# Patient Record
Sex: Female | Born: 1980 | Hispanic: Yes | Marital: Single
Health system: Midwestern US, Community
[De-identification: ages and names within clinical notes are randomized; demographics above are authoritative.]

## PROBLEM LIST (undated history)

## (undated) DIAGNOSIS — I10 Essential (primary) hypertension: Secondary | ICD-10-CM

## (undated) DIAGNOSIS — G8929 Other chronic pain: Secondary | ICD-10-CM

## (undated) DIAGNOSIS — D649 Anemia, unspecified: Secondary | ICD-10-CM

## (undated) DIAGNOSIS — W3400XA Accidental discharge from unspecified firearms or gun, initial encounter: Secondary | ICD-10-CM

## (undated) DIAGNOSIS — M549 Dorsalgia, unspecified: Secondary | ICD-10-CM

## (undated) DIAGNOSIS — Y249XXA Unspecified firearm discharge, undetermined intent, initial encounter: Secondary | ICD-10-CM

## (undated) DIAGNOSIS — M069 Rheumatoid arthritis, unspecified: Secondary | ICD-10-CM

## (undated) DIAGNOSIS — N390 Urinary tract infection, site not specified: Secondary | ICD-10-CM

## (undated) DIAGNOSIS — Z803 Family history of malignant neoplasm of breast: Secondary | ICD-10-CM

## (undated) HISTORY — DX: Family history of malignant neoplasm of breast: Z80.3

## (undated) HISTORY — PX: NASAL SEPTUM SURGERY: SHX37

## (undated) HISTORY — DX: Anemia, unspecified: D64.9

## (undated) HISTORY — PX: TUBAL LIGATION: SHX77

---

## 2015-02-27 ENCOUNTER — Emergency Department (HOSPITAL_BASED_OUTPATIENT_CLINIC_OR_DEPARTMENT_OTHER)
Admission: EM | Admit: 2015-02-27 | Discharge: 2015-02-27 | Disposition: A | Discharge status: Home / Self Care | Payer: Self-pay | Attending: Emergency Medicine | Admitting: Emergency Medicine

## 2015-02-27 ENCOUNTER — Encounter (HOSPITAL_BASED_OUTPATIENT_CLINIC_OR_DEPARTMENT_OTHER): Payer: Self-pay | Admitting: Emergency Medicine

## 2015-02-27 DIAGNOSIS — Y9389 Activity, other specified: Secondary | ICD-10-CM | POA: Insufficient documentation

## 2015-02-27 DIAGNOSIS — X58XXXA Exposure to other specified factors, initial encounter: Secondary | ICD-10-CM | POA: Insufficient documentation

## 2015-02-27 DIAGNOSIS — Y998 Other external cause status: Secondary | ICD-10-CM | POA: Insufficient documentation

## 2015-02-27 DIAGNOSIS — Y9289 Other specified places as the place of occurrence of the external cause: Secondary | ICD-10-CM | POA: Insufficient documentation

## 2015-02-27 DIAGNOSIS — T192XXA Foreign body in vulva and vagina, initial encounter: Secondary | ICD-10-CM | POA: Insufficient documentation

## 2015-02-27 NOTE — ED Notes (Signed)
Pt states that she has a condom stuck in her vagina that has been there since 2330.  Pt denies any current pain.

## 2015-02-27 NOTE — ED Notes (Signed)
Pt has condom in vagina x 40 min PTA

## 2015-02-27 NOTE — ED Notes (Signed)
Pt verbalizes understanding of d/c instructions and denies any further need at this time. 

## 2015-02-27 NOTE — ED Provider Notes (Signed)
CSN: [id number]     Arrival date & time 02/27/15  0006 History   First MD Initiated Contact with Patient 02/27/15 0141     Chief Complaint  Patient presents with  . Foreign Body in Vagina     (Consider location/radiation/quality/duration/timing/severity/associated sxs/prior Treatment) HPI  This is a 34 year old female who has had a condom stuck in her vagina since about 11:30 yesterday evening. She is not having any pain or bleeding.  History reviewed. No pertinent past medical history. History reviewed. No pertinent past surgical history. No family history on file. Social History  Substance Use Topics  . Smoking status: Never Smoker   . Smokeless tobacco: None  . Alcohol Use: None   OB History    No data available     Review of Systems  All other systems reviewed and are negative.   Allergies  Review of patient's allergies indicates no known allergies.  Home Medications   Prior to Admission medications   Not on File   BP 116/75 mmHg  Pulse 99  Temp(Src) 98 F (36.7 C) (Oral)  Resp 18  SpO2 100%   Physical Exam  General: Well-developed, well-nourished female in no acute distress; appearance consistent with age of record HENT: normocephalic; atraumatic Eyes: Normal appearance Neck: supple Heart: regular rate and rhythm Lungs: Normal respiratory effort and excursion Abdomen: soft; nondistended GU: Normal external genitalia; foreign object consistent with a condom within vaginal vault Extremities: No deformity; full range of motion Neurologic: Awake, alert and oriented; motor function intact in all extremities and symmetric; no facial droop Skin: Warm and dry Psychiatric: Normal mood and affect    ED Course  Procedures (including critical care time)   MDM  Condom removed manually with lubricated gloved hand.   Paula Libra, MD 02/27/15 [phone]

## 2015-03-06 ENCOUNTER — Encounter (HOSPITAL_BASED_OUTPATIENT_CLINIC_OR_DEPARTMENT_OTHER): Payer: Self-pay

## 2015-03-06 ENCOUNTER — Emergency Department (HOSPITAL_BASED_OUTPATIENT_CLINIC_OR_DEPARTMENT_OTHER)
Admission: EM | Admit: 2015-03-06 | Discharge: 2015-03-06 | Disposition: A | Discharge status: Home / Self Care | Payer: Self-pay | Attending: Emergency Medicine | Admitting: Emergency Medicine

## 2015-03-06 DIAGNOSIS — J029 Acute pharyngitis, unspecified: Secondary | ICD-10-CM | POA: Insufficient documentation

## 2015-03-06 LAB — RAPID STREP SCREEN (MED CTR MEBANE ONLY): Streptococcus, Group A Screen (Direct): NEGATIVE

## 2015-03-06 MED ORDER — IBUPROFEN 800 MG PO TABS: 800.0000 mg | ORAL_TABLET | Freq: Three times a day (TID) | ORAL | Status: DC

## 2015-03-06 MED ORDER — DEXAMETHASONE SODIUM PHOSPHATE 10 MG/ML IJ SOLN
10.0000 mg | Freq: Once | INTRAMUSCULAR | Status: AC
  Administered 2015-03-06: 10 mg via INTRAMUSCULAR
  Filled 2015-03-06: qty 1

## 2015-03-06 MED ORDER — PENICILLIN G BENZATHINE 1200000 UNIT/2ML IM SUSP
1.2000 10*6.[IU] | Freq: Once | INTRAMUSCULAR | Status: AC
  Administered 2015-03-06: 1.2 10*6.[IU] via INTRAMUSCULAR
  Filled 2015-03-06: qty 2

## 2015-03-06 NOTE — ED Notes (Signed)
Patient preparing for discharge. 

## 2015-03-06 NOTE — ED Notes (Signed)
MD at bedside. 

## 2015-03-06 NOTE — ED Notes (Signed)
Reports sore throat that started yesterday.  Reports difficulty eating due to pain.  Reports having chills and fever.

## 2015-03-06 NOTE — ED Provider Notes (Signed)
CSN: [id number]     Arrival date & time 03/06/15  0756 History   First MD Initiated Contact with Patient 03/06/15 [phone]     Chief Complaint  Patient presents with  . Sore Throat     (Consider location/radiation/quality/duration/timing/severity/associated sxs/prior Treatment) Patient is a 34 y.o. female presenting with pharyngitis.  Sore Throat This is a new problem. The current episode started 12 to 24 hours ago. The problem occurs constantly. The problem has been gradually worsening. Pertinent negatives include no chest pain, no abdominal pain, no headaches and no shortness of breath. Nothing aggravates the symptoms. Nothing relieves the symptoms. She has tried nothing for the symptoms. The treatment provided mild relief.    History reviewed. No pertinent past medical history. History reviewed. No pertinent past surgical history. No family history on file. Social History  Substance Use Topics  . Smoking status: Never Smoker   . Smokeless tobacco: None  . Alcohol Use: No   OB History    No data available     Review of Systems  Constitutional: Positive for fever and chills.  HENT: Positive for sore throat. Negative for congestion, ear pain and facial swelling.   Respiratory: Negative for cough, shortness of breath, wheezing and stridor.   Cardiovascular: Negative for chest pain.  Gastrointestinal: Negative for nausea, vomiting and abdominal pain.  Neurological: Negative for headaches.      Allergies  Review of patient's allergies indicates no known allergies.  Home Medications   Prior to Admission medications   Medication Sig Start Date End Date Taking? Authorizing Provider  ibuprofen (ADVIL,MOTRIN) 800 MG tablet Take 1 tablet (800 mg total) by mouth 3 (three) times daily. 03/06/15   Marily Memos, MD   BP 114/86 mmHg  Pulse 80  Temp(Src) 98.7 F (37.1 C) (Oral)  Resp 16  Ht 5\' 2"  (1.575 m)  Wt 149 lb (67.586 kg)  BMI 27.25 kg/m2  SpO2 100%  LMP  02/16/2015 Physical Exam  Constitutional: She is oriented to person, place, and time. She appears well-developed and well-nourished.  HENT:  Head: Normocephalic and atraumatic.  Mouth/Throat: Uvula is midline. Oropharyngeal exudate, posterior oropharyngeal edema and posterior oropharyngeal erythema present. No tonsillar abscesses.  Eyes: Conjunctivae and EOM are normal. Right eye exhibits no discharge. Left eye exhibits no discharge.  Cardiovascular: Normal rate and regular rhythm.   Pulmonary/Chest: Effort normal and breath sounds normal. No respiratory distress.  Abdominal: Soft. She exhibits no distension. There is no tenderness. There is no rebound.  Musculoskeletal: Normal range of motion. She exhibits no edema or tenderness.  Neurological: She is alert and oriented to person, place, and time.  Skin: Skin is warm and dry.  Nursing note and vitals reviewed.   ED Course  Procedures (including critical care time) Labs Review Labs Reviewed  RAPID STREP SCREEN (NOT AT [facility])    Imaging Review No results found. I have personally reviewed and evaluated these images and lab results as part of my medical decision-making.   EKG Interpretation None      MDM   Final diagnoses:  Pharyngitis   34 year old female with 1 day of sore throat. The no trouble breathing, nausea, vomiting or dysphagia. Exam as above without any evidence of PTA. No stridor to suggest RPA. Treated with Decadron and penicillin. Will go home with ibuprofen.  I have personally and contemperaneously reviewed labs and imaging and used in my decision making as above.   A medical screening exam was performed and I feel the patient has  had an appropriate workup for their chief complaint at this time and likelihood of emergent condition existing is low. They have been counseled on decision, discharge, follow up and which symptoms necessitate immediate return to the emergency department. They or their family verbally  stated understanding and agreement with plan and discharged in stable condition.      Marily Memos, MD 03/06/15 [phone]

## 2015-03-06 NOTE — Discharge Instructions (Signed)
°Emergency Department Resource Guide °1) Find a Doctor and Pay Out of Pocket °Although you won't have to find out who is covered by your insurance plan, it is a good idea to ask around and get recommendations. You will then need to call the office and see if the doctor you have chosen will accept you as a new patient and what types of options they offer for patients who are self-pay. Some doctors offer discounts or will set up payment plans for their patients who do not have insurance, but you will need to ask so you aren't surprised when you get to your appointment. ° °2) Contact Your Local Health Department °Not all health departments have doctors that can see patients for sick visits, but many do, so it is worth a call to see if yours does. If you don't know where your local health department is, you can check in your phone book. The CDC also has a tool to help you locate your state's health department, and many state websites also have listings of all of their local health departments. ° °3) Find a Walk-in Clinic °If your illness is not likely to be very severe or complicated, you may want to try a walk in clinic. These are popping up all over the country in pharmacies, drugstores, and shopping centers. They're usually staffed by nurse practitioners or physician assistants that have been trained to treat common illnesses and complaints. They're usually fairly quick and inexpensive. However, if you have serious medical issues or chronic medical problems, these are probably not your best option. ° °No Primary Care Doctor: °- Call [facility] at  832-8000 - they can help you locate a primary care doctor that  accepts your insurance, provides certain services, etc. °- Physician Referral Service- 1-800-533-3463 ° °Chronic Pain Problems: °Organization         Address  Phone   Notes  °[facility]  (336) 297-2271 Patients need to be referred by their primary care doctor.  ° °Medication  Assistance: °Organization         Address  Phone   Notes  °[place] [facility] [address] °[place], [place] [address] (336) 641-8030 --Must be a resident of [place] °-- Must have NO insurance coverage whatsoever (no Medicaid/ Medicare, etc.) °-- The pt. MUST have a primary care doctor that directs their care regularly and follows them in the community °  °[facility]  (866) 331-1348   °[employer]  (888) 892-1162   ° °Agencies that provide inexpensive medical care: °Organization         Address  Phone   Notes  °[facility]  (336) 832-8035   °[facility]    (336) 832-7272   °[facility] [address] °[place], [place] [address] (336) 832-4777   °[facility] [address], °[place] (336) 271-4999   °[facility]    (336) 373-0678   °[facility]    (336) 272-1050   °[facility] ° [address], [place] Phone:  (336) 832-4444, Fax:  (336) 832-4440 Hours of Operation:  9 am - 6 pm, M-F.  Also accepts Medicaid/Medicare and self-pay.  °[facility] ° [address], [place] Phone: (336) 832-3150, Fax: (336) 832-3151. Hours of Operation:  8:30 am - 5:30 pm, M-F.  Also accepts Medicaid and self-pay.  °[facility] [place] [address], [place] Phone: (336) 878-6027   °[facility] [address], [place], [place] (336)723-1848, Ext. 123 Mondays & Thursdays: 7-9 AM.  First 15 patients are seen on a first come, first serve basis. °  ° °Medicaid-accepting [place] Providers: ° °Organization         Address  Phone   Notes  °[facility] [address], [place] (336) 641-2100 Also accepts self-pay patients.  °[facility] [address], [place] ° (336) 856-9996   °[facility] [address], [place]  (336) 288-8857   °[facility] [address] [place] [address], [place] (336) 299-7000   °Veita Bland [address], [place]  ° (336) 373-1557 Only accepts [place] Access Medicaid patients after they have their name applied to their card.  ° °Self-Pay (no insurance) in [place]: ° °Organization         Address  Phone   Notes  °Sickle Cell Patients, [facility] [address], [place] (336) 832-1970   °[facility] [address], [place] (336) 832-4400   °[facility] ° [address], [place] (336) 992-4800   °[facility]/Dr. Osei-Bonsu ° [address] [place] [address], [place] or [address], [place] (336) 841-8500 Phone number for both [place] and [place] locations is the same.  °[facility] [address], [place] (336) 299-0000   °[facility] [address] [place] [address], [place] or [address] (336) 852-7530 °(336) 878-2260   °[facility] [address], [place] (336) 350-1642, phone; (336) 294-5005, fax Sees patients 1st and 3rd Saturday of every month.  Must not qualify for public or private insurance (i.e. Medicaid, Medicare, [place] Health Choice, Veterans' Benefits) • Household income should be no more than 200% of the poverty level •The clinic cannot treat you if you are pregnant or think you are pregnant • Sexually transmitted diseases are not treated at the clinic.  ° ° °Dental Care: °Organization         Address  Phone  Notes  °[place] [facility] [address], [place] (336) 641-6152 Accepts children up to age 21 who are enrolled in Medicaid or [place] Health Choice; pregnant women with a Medicaid card; and children who have applied for Medicaid or [place] Health Choice, but were declined, whose parents can pay a reduced fee at time of service.  °[place]  [facility] [place]  [address], [place] (336) 641-7733 Accepts children up to age 21 who are enrolled in Medicaid or [place] Health Choice; pregnant women with a Medicaid card; and children who have applied for Medicaid or [place] Health Choice, but were declined, whose parents can pay a reduced fee at time of service.  °[facility] ° [address], [place] (336) 641-4533 Patients are seen by appointment only. Walk-ins are not accepted. [facility] will see patients 18 years of age and older. °Monday - Tuesday (8am-5pm) °Most Wednesdays (8:30-5pm) °$30 per visit, cash only  °[facility] ° [address], [place] (336) 641-4533 Patients are seen by appointment only. Walk-ins are not accepted. [facility] will see patients 18 years of age and older. °One   Wednesday Evening (Monthly: Volunteer Based).  $30 per visit, cash only  °[facility]  (919) 537-3737 for adults; Children under age 4, call Graduate Pediatric Dentistry at (919) [phone]. Children aged 4-14, please call (919) [phone] to request a pediatric application. ° Dental services are provided in all areas of dental care including fillings, crowns and bridges, complete and partial dentures, implants, gum treatment, root canals, and extractions. Preventive care is also provided. Treatment is provided to both adults and children. °Patients are selected via a lottery and there is often a waiting list. °  °[facility] [address], °[place] ° (336) 763-8833 [email or web address] °  °[facility] [address], [place], [place] (336)723-1848, Ext. 123 Second and Fourth Thursday of each month, opens at 6:30 AM; Clinic ends at 9 AM.  Patients are seen on a first-come first-served basis, and a limited number are seen during each clinic.  ° °[facility] ° [address], [place], [place] (336) 723-7904    Eligibility Requirements °You must have lived in [place], [place], or [place] counties for at least the last three months. °  You cannot be eligible for state or federal sponsored healthcare insurance, including Veterans Administration, Medicaid, or Medicare. °  You generally cannot be eligible for healthcare insurance through your employer.  °  How to apply: °Eligibility screenings are held every Tuesday and Wednesday afternoon from 1:00 pm until 4:00 pm. You do not need an appointment for the interview!  °[facility] [address], [place], [place] 336-631-2330   °[facility]  336-342-8273   °[facility]  336-703-3100   °[facility]  336-570-6415   ° °Behavioral Health Resources in the Community: °Intensive Outpatient Programs °Organization         Address  Phone  Notes  °[facility] [address], [place], [place] 336-878-6098   °[facility] [address], [place], [place] 336-832-9800   °[facility] [address], [place], [place] ° 336-882-2125   °[facility] [address],  °[place], [place] 1-800-853-5163 or 336-641-4981   °Substance Abuse Resources °Organization         Address  Phone  Notes  °[facility]  336-882-2125   °[facility]  336-784-9470   °[facility]  336-285-9073   °[facility]  336-845-3988   °Residential & Outpatient Substance Abuse Program  1-800-659-3381   °Psychological Services °Organization         Address  Phone  Notes  °[facility]  336- 832-9600   °[facility]  336- 378-7881   °[facility] [address], [place] 1-800-853-5163 or 336-641-4981   ° °Mobile Crisis Teams °Organization         Address  Phone  Notes  °[facility], Mobile Crisis Care Unit  1-877-626-1772   °Assertive °[facility] ° [address]  [place], [place] 336-834-9664   °Sharon DeEsch [address] °[place] 336-554-5454   ° °Self-Help/Support Groups °Organization         Address  Phone             Notes  °[facility] [place] - variety of support groups  336- 373-1402 Call for more information  °Narcotics Anonymous (NA), [facility] [address], °[place]  2 meetings at this location  ° °  Residential Treatment Programs °Organization         Address  Phone  Notes  °[facility] [address],    °[place]  1-866-801-8205   °[facility] ° [address], [place], [place] 704-293-8524   °[facility] [address], [place] 336-845-3988 Admissions: 8am-3pm M-F  °[facility] [address]    °[place], [place] 336-841-1104   °[facility] [address], [place], [place] 336-379-7146   °[facility] [address]  °[place], [place] 336-285-9073   °[facility] - Intensive Outpatient [address], [place], [place] 336-852-3033   °[facility] [address]  °[place], [place] 1-877-615-2722 or 336-784-9470   °[facility] [address] , [place] 336-227-7417 Accepts Medicaid  °[facility] [address]  ° [place] 1-800-659-3381 Substance Abuse/Addiction Treatment  ° °[place] Behavioral Health Resources °Organization         Address  Phone  Notes  °[facility]  (888) 581-9988   °Julie Brannon, PhD [address] [place], [place]   (336) 349-5553 or (336) 951-0000   °[facility]   [address] °[place], [place] (336) 349-4454   °[facility] [address], [place], [place] (336) 342-8316 Insurance/Medicaid/sponsorship through [facility]  °[facility] [address]                                    [place], [place] (336) 342-8316 Therapy/tele-psych/case    °[facility] [address]  ° [place], [place] (336) 349-2233    °Dr. Arfeen  (336) 349-4544   °[facility] [place]  [employer] [place] [facility] 1) [address], [place] °2) [address], [place] °3)  [address], [place] (336) 349-3220 °(336) 342-7768 ° °(336) 342-8140   °[place] [facility] (336) 342-1394 or (336) 342-3537 (After Hours)    ° ° °

## 2015-03-09 LAB — CULTURE, GROUP A STREP

## 2015-05-04 ENCOUNTER — Emergency Department (HOSPITAL_COMMUNITY)
Admission: EM | Admit: 2015-05-04 | Discharge: 2015-05-04 | Disposition: A | Discharge status: Home / Self Care | Payer: Self-pay | Attending: Emergency Medicine | Admitting: Emergency Medicine

## 2015-05-04 ENCOUNTER — Encounter (HOSPITAL_COMMUNITY): Payer: Self-pay | Admitting: General Practice

## 2015-05-04 DIAGNOSIS — Z3202 Encounter for pregnancy test, result negative: Secondary | ICD-10-CM | POA: Insufficient documentation

## 2015-05-04 DIAGNOSIS — N390 Urinary tract infection, site not specified: Secondary | ICD-10-CM | POA: Insufficient documentation

## 2015-05-04 DIAGNOSIS — Z87828 Personal history of other (healed) physical injury and trauma: Secondary | ICD-10-CM | POA: Insufficient documentation

## 2015-05-04 DIAGNOSIS — Z791 Long term (current) use of non-steroidal anti-inflammatories (NSAID): Secondary | ICD-10-CM | POA: Insufficient documentation

## 2015-05-04 HISTORY — DX: Urinary tract infection, site not specified: N39.0

## 2015-05-04 HISTORY — DX: Unspecified firearm discharge, undetermined intent, initial encounter: Y24.9XXA

## 2015-05-04 HISTORY — DX: Accidental discharge from unspecified firearms or gun, initial encounter: W34.00XA

## 2015-05-04 LAB — COMPREHENSIVE METABOLIC PANEL
ALBUMIN: 3.7 g/dL (ref 3.5–5.0)
ALK PHOS: 63 U/L (ref 38–126)
ALT: 17 U/L (ref 14–54)
ANION GAP: 8 (ref 5–15)
AST: 23 U/L (ref 15–41)
BILIRUBIN TOTAL: 0.5 mg/dL (ref 0.3–1.2)
BUN: 12 mg/dL (ref 6–20)
CALCIUM: 9.3 mg/dL (ref 8.9–10.3)
CO2: 24 mmol/L (ref 22–32)
CREATININE: 0.71 mg/dL (ref 0.44–1.00)
Chloride: 103 mmol/L (ref 101–111)
GFR calc Af Amer: >60 mL/min (ref >60)
GFR calc non Af Amer: >60 mL/min (ref >60)
GLUCOSE: 105 mg/dL — AB (ref 65–99)
Potassium: 3.5 mmol/L (ref 3.5–5.1)
Sodium: 135 mmol/L (ref 135–145)
TOTAL PROTEIN: 8.1 g/dL (ref 6.5–8.1)

## 2015-05-04 LAB — CBC WITH DIFFERENTIAL/PLATELET
Basophils Absolute: 0 10*3/uL (ref 0.0–0.1)
Basophils Relative: 0 %
Eosinophils Absolute: 0.1 10*3/uL (ref 0.0–0.7)
Eosinophils Relative: 1 %
HEMATOCRIT: 40.8 % (ref 36.0–46.0)
HEMOGLOBIN: 13.4 g/dL (ref 12.0–15.0)
LYMPHS PCT: 18 %
Lymphs Abs: 1.8 10*3/uL (ref 0.7–4.0)
MCH: 27.7 pg (ref 26.0–34.0)
MCHC: 32.8 g/dL (ref 30.0–36.0)
MCV: 84.5 fL (ref 78.0–100.0)
MONO ABS: 0.5 10*3/uL (ref 0.1–1.0)
MONOS PCT: 5 %
NEUTROS ABS: 7.9 10*3/uL — AB (ref 1.7–7.7)
Neutrophils Relative %: 76 %
Platelets: 294 10*3/uL (ref 150–400)
RBC: 4.83 MIL/uL (ref 3.87–5.11)
RDW: 13.6 % (ref 11.5–15.5)
WBC: 10.4 10*3/uL (ref 4.0–10.5)

## 2015-05-04 LAB — I-STAT BETA HCG BLOOD, ED (MC, WL, AP ONLY): I-stat hCG, quantitative: <5 m[IU]/mL (ref <5)

## 2015-05-04 LAB — URINALYSIS, ROUTINE W REFLEX MICROSCOPIC
Bilirubin Urine: NEGATIVE
GLUCOSE, UA: NEGATIVE mg/dL
Ketones, ur: NEGATIVE mg/dL
Nitrite: NEGATIVE
PH: 5.5 (ref 5.0–8.0)
Protein, ur: 30 mg/dL — AB
Specific Gravity, Urine: 1.027 (ref 1.005–1.030)
Urobilinogen, UA: 0.2 mg/dL (ref 0.0–1.0)

## 2015-05-04 LAB — URINE MICROSCOPIC-ADD ON

## 2015-05-04 LAB — LIPASE, BLOOD: Lipase: 42 U/L (ref 11–51)

## 2015-05-04 MED ORDER — PHENAZOPYRIDINE HCL 200 MG PO TABS: 200.0000 mg | ORAL_TABLET | Freq: Three times a day (TID) | ORAL | Status: DC

## 2015-05-04 MED ORDER — NITROFURANTOIN MONOHYD MACRO 100 MG PO CAPS: 100.0000 mg | ORAL_CAPSULE | Freq: Two times a day (BID) | ORAL | Status: DC

## 2015-05-04 NOTE — Discharge Instructions (Signed)
Take your medications as prescribed. Please follow up with a primary care provider from the Resource Guide provided below in 1 week. Please return to the Emergency Department if symptoms worsen or new onset of fever, chills, abdominal pain, vomiting, blood in urine, back/flank pain.   Emergency Department Resource Guide 1) Find a Doctor and Pay Out of Pocket Although you won't have to find out who is covered by your insurance plan, it is a good idea to ask around and get recommendations. You will then need to call the office and see if the doctor you have chosen will accept you as a new patient and what types of options they offer for patients who are self-pay. Some doctors offer discounts or will set up payment plans for their patients who do not have insurance, but you will need to ask so you aren't surprised when you get to your appointment.  2) Contact Your Local Health Department Not all health departments have doctors that can see patients for sick visits, but many do, so it is worth a call to see if yours does. If you don't know where your local health department is, you can check in your phone book. The CDC also has a tool to help you locate your state's health department, and many state websites also have listings of all of their local health departments.  3) Find a Walk-in Clinic If your illness is not likely to be very severe or complicated, you may want to try a walk in clinic. These are popping up all over the country in pharmacies, drugstores, and shopping centers. They're usually staffed by nurse practitioners or physician assistants that have been trained to treat common illnesses and complaints. They're usually fairly quick and inexpensive. However, if you have serious medical issues or chronic medical problems, these are probably not your best option.  No Primary Care Doctor: - Call Health Connect at  [phone] - they can help you locate a primary care doctor that  accepts your  insurance, provides certain services, etc. - Physician Referral Service- [phone]  Chronic Pain Problems: Organization         Address  Phone   Notes  [facility]  [phone] Patients need to be referred by their primary care doctor.   Medication Assistance: Organization         Address  Phone   Notes  [facility] Medication [facility] [address] [place] [place], [place] [address] [phone] --Must be a resident of [facility] -- Must have NO insurance coverage whatsoever (no Medicaid/ Medicare, etc.) -- The pt. MUST have a primary care doctor that directs their care regularly and follows them in the community   Med[facility]ist  [phone]   [employer]  [phone]    Agencies that provide inexpensive medical care: Organization         Address  Phone   Notes  [facility]  [phone]   [facility]    [phone]   [facility] [address] [place], [place] [address] [phone]   [facility] [address] [place]. [address] [place] [phone]   Planned Parenthood    [phone]   [facility]    [phone]   [facility]  [address], [place] Phone:  [phone], Fax:  [phone] Hours of Operation:  9 am - 6 pm, M-F.  Also accepts Medicaid/Medicare and self-pay.  [facility] for [place], [address], [place] Phone: [phone], Fax: [phone]. Hours of Operation:  8:30 am - 5:30 pm, M-F.  Also accepts Medicaid and self-pay.  [facility] [place] [address], [place] Phone: [phone]   [place], [place], [place] [phone], Ext. [phone] Mondays & Thursdays: 7-9 AM.  First 15 patients are seen on a first come, first serve basis.    [place] Providers:  Organization          Address  Phone   Notes  [facility] [address], [place] [phone] Also accepts self-pay patients.  [facility] [address] [place] [address], [place]  [phone]   [place], [address], [place] [phone]   [facility] [address], [place] [phone]   Lucianne Lei [address], [place]   [phone] Only accepts [place] Access [place] patients after they have their name applied to their card.   Self-Pay (no insurance) in [facility]:  Organization         Address  Phone   Notes  Sickle Cell Patients, [facility] [place] [phone]   [facility] Urgent Care [place] [phone]   [facility] [place]  [place], [place], [place] [phone]   [facility]/Dr. Osei-Bonsu  [address] [place] or [address], [place] [phone] Phone number for both [place] and [place] locations is the same.  Urgent Medical and [facility] [address], [place] [phone]   [facility] [address], [place] or [address] [phone]   [facility] [address] [place] [phone], phone; [phone], fax Sees patients 1st and 3rd Saturday of every month.  Must not qualify for public or private insurance (i.e. Medicaid, Medicare, [place] Health Choice, Veterans' Benefits)  Household income should be no more than 200% of the poverty level The clinic cannot treat you if you are pregnant or think you are pregnant  Sexually transmitted diseases are not treated at the clinic.    Dental Care: Organization         Address  Phone  Notes  [facility] Department of [facility] [place] [phone] Accepts children up to age 40 who are enrolled in [place] or [place]; pregnant women with a Medicaid card; and children who have applied for Medicaid or [place] Health Choice, but were declined, whose parents can pay a reduced fee at time of service.  [facility] Department of [facility]  [address], [place] [phone] Accepts children up to age 10 who are enrolled in [place] or [place]; pregnant women with a Medicaid card; and children who have applied for Medicaid or [place] Health Choice, but were declined, whose parents can pay a reduced fee at time of service.  [place] Adult Dental Access PROGRAM  [place] [phone] Patients are seen by appointment only. Walk-ins are not accepted. [place] will see patients 12 years of age and older. Monday - Tuesday (  8am-5pm) Most Wednesdays (8:30-5pm) $30 per visit, cash only  [facility] Adult Dental Access PROGRAM  [address], [facility] [phone] Patients are seen by appointment only. Walk-ins are not accepted. [place] will see patients 35 years of age and older. One Wednesday Evening (Monthly: Volunteer Based).  $30 per visit, cash only  [place]  [phone] for adults; Children under age 38, call Graduate Pediatric Dentistry at [phone]. Children aged 78-14, please call [phone] to request a pediatric application.  Dental services are provided in all areas of dental care including fillings, crowns and bridges, complete and partial dentures, implants, gum treatment, root canals, and extractions. Preventive care is also provided. Treatment is provided to both adults and children. Patients are selected via a lottery and there is often a waiting list.   [facility] [address] [place]  [phone] [email or web address]   [facility] [address] [place], [place]  [phone], Ext. [phone] Second and Fourth Thursday of each month, opens at 6:30 AM; Clinic ends at 9 AM.  Patients are seen on a first-come first-served basis, and a limited number are seen during each clinic.   [facility]  [address] Hillard Danker [place], [place] [phone]   Eligibility Requirements You must have lived in [place], [place], or [place] counties for at least the last three months.   You cannot be eligible for state or federal sponsored [employer], including [employer], [place], or [employer].   You generally cannot be eligible for healthcare insurance through your employer.    How to apply: Eligibility screenings are held every Tuesday and Wednesday afternoon from 1:00 pm until 4:00 pm. You do not need an appointment for the interview!  [facility] [address] [place], [place]   [place]  [place] Department  [place]  [phone]    Behavioral Health Resources in the Community: Intensive Outpatient Programs Organization         Address  Phone  Notes  [place]. [address] [place], [place] [phone]   [facility]patient [address] [place], [place]   ADS: Alcohol & Drug Svcs [address], [place], [place]   [place] [address]  [place], [place] or [phone]   Substance Abuse Resources Organization         Address  Phone  Notes  Alcohol and Drug Services  [phone]   [place]  [phone]   [facility]   [place]  [phone]   Residential & Outpatient Substance Abuse Program  [phone]   Psychological Services Organization         Address  Phone  Notes  [facility] [place]  [place]  [phone]    [place] [address], [place] or [phone]    Mobile Crisis Teams Organization         Address  Phone  Notes  [facility], Mobile Crisis Care Unit  [phone]   [facility]  [address] [place], [place]   [place] [address] [place] [phone]    Self-Help/Support Groups Organization         Address  Phone  Notes  [facility]ental Health Assoc. of [place] - variety of support groups  [place] Call for more information  Narcotics Anonymous (NA), [facility]aring [facility] [address], [employer] [place]  2 meetings at this location   Special educational needs teacher         Address  Phone  Notes  [facility]idential Treatment [place],    [place]  [phone]   [facility]  [address] [place] [phone], [place], [place]   [place], [place] [phone] Admissions: 8am-3pm M-F  Incentives Substance [place] [address]    [place], [place] [phone]   [facility] [address] [place], [place], [place]   [facility] [address],  [place], [place]   [facility] - Intensive Outpatient [address], [place], [place]   [facility] ([place].,  [place], [place] [phone] or [phone]   [facility] Services (RTS) [address] [place], [place] Accepts Medicaid  [facility] [place] [address],  [place] [phone] Substance Abuse/Addiction Treatment   [facility] Organization         Address  Phone  Notes  [facility]  [phone]   Domenic Schwab, PhD [address] Arlis [place], [place]   [phone] or [phone]   [place]  [place], [place] [phone]   [facility] [address] [place] [address] [place], [place] [phone] Insurance/Medicaid/sponsorship through [facility] and Families [address]                                    [place], [place] [phone] [place] [address], [place] [phone]    Dr. Adele Schilder  [phone]   [facility] Dept. 1) [address], [place] 2) [place] 3)  [place] [address], [place] [phone]  [phone]   [place] [phone] or [phone] (After Hours)
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
## 2015-05-04 NOTE — ED Notes (Signed)
Pt complaining of pain with urination. Pt reporting symptoms started Thursday. Pt experiencing burning with urination and frequency. Pt denies any fever/chills. Pt rating pain a 8/10.

## 2015-05-05 LAB — URINE CULTURE: Special Requests: NORMAL

## 2015-05-31 ENCOUNTER — Emergency Department (HOSPITAL_BASED_OUTPATIENT_CLINIC_OR_DEPARTMENT_OTHER)
Admission: EM | Admit: 2015-05-31 | Discharge: 2015-05-31 | Disposition: A | Discharge status: Home / Self Care | Payer: Self-pay | Attending: Emergency Medicine | Admitting: Emergency Medicine

## 2015-05-31 ENCOUNTER — Encounter (HOSPITAL_BASED_OUTPATIENT_CLINIC_OR_DEPARTMENT_OTHER): Payer: Self-pay | Admitting: Emergency Medicine

## 2015-05-31 DIAGNOSIS — R112 Nausea with vomiting, unspecified: Secondary | ICD-10-CM

## 2015-05-31 DIAGNOSIS — R1013 Epigastric pain: Secondary | ICD-10-CM | POA: Insufficient documentation

## 2015-05-31 DIAGNOSIS — Z79899 Other long term (current) drug therapy: Secondary | ICD-10-CM | POA: Insufficient documentation

## 2015-05-31 DIAGNOSIS — Z9851 Tubal ligation status: Secondary | ICD-10-CM | POA: Insufficient documentation

## 2015-05-31 DIAGNOSIS — Z791 Long term (current) use of non-steroidal anti-inflammatories (NSAID): Secondary | ICD-10-CM | POA: Insufficient documentation

## 2015-05-31 DIAGNOSIS — Z87828 Personal history of other (healed) physical injury and trauma: Secondary | ICD-10-CM | POA: Insufficient documentation

## 2015-05-31 DIAGNOSIS — Z3202 Encounter for pregnancy test, result negative: Secondary | ICD-10-CM | POA: Insufficient documentation

## 2015-05-31 DIAGNOSIS — Z792 Long term (current) use of antibiotics: Secondary | ICD-10-CM | POA: Insufficient documentation

## 2015-05-31 DIAGNOSIS — R197 Diarrhea, unspecified: Secondary | ICD-10-CM | POA: Insufficient documentation

## 2015-05-31 DIAGNOSIS — Z87440 Personal history of urinary (tract) infections: Secondary | ICD-10-CM | POA: Insufficient documentation

## 2015-05-31 LAB — URINALYSIS, ROUTINE W REFLEX MICROSCOPIC
BILIRUBIN URINE: NEGATIVE
Glucose, UA: NEGATIVE mg/dL
Hgb urine dipstick: NEGATIVE
KETONES UR: NEGATIVE mg/dL
NITRITE: NEGATIVE
PROTEIN: NEGATIVE mg/dL
Specific Gravity, Urine: 1.026 (ref 1.005–1.030)
pH: 7 (ref 5.0–8.0)

## 2015-05-31 LAB — CBC
HCT: 40.1 % (ref 36.0–46.0)
Hemoglobin: 13.1 g/dL (ref 12.0–15.0)
MCH: 27.2 pg (ref 26.0–34.0)
MCHC: 32.7 g/dL (ref 30.0–36.0)
MCV: 83.4 fL (ref 78.0–100.0)
Platelets: 378 K/uL (ref 150–400)
RBC: 4.81 MIL/uL (ref 3.87–5.11)
RDW: 13.7 % (ref 11.5–15.5)
WBC: 10.2 K/uL (ref 4.0–10.5)

## 2015-05-31 LAB — COMPREHENSIVE METABOLIC PANEL WITH GFR
ALT: 20 U/L (ref 14–54)
AST: 24 U/L (ref 15–41)
Albumin: 4.2 g/dL (ref 3.5–5.0)
Alkaline Phosphatase: 69 U/L (ref 38–126)
Anion gap: 8 (ref 5–15)
BUN: 10 mg/dL (ref 6–20)
CO2: 23 mmol/L (ref 22–32)
Calcium: 9.1 mg/dL (ref 8.9–10.3)
Chloride: 105 mmol/L (ref 101–111)
Creatinine, Ser: 0.7 mg/dL (ref 0.44–1.00)
GFR calc Af Amer: >60 mL/min
GFR calc non Af Amer: >60 mL/min
Glucose, Bld: 97 mg/dL (ref 65–99)
Potassium: 3.7 mmol/L (ref 3.5–5.1)
Sodium: 136 mmol/L (ref 135–145)
Total Bilirubin: 0.5 mg/dL (ref 0.3–1.2)
Total Protein: 8.3 g/dL — ABNORMAL HIGH (ref 6.5–8.1)

## 2015-05-31 LAB — LIPASE, BLOOD: LIPASE: 40 U/L (ref 11–51)

## 2015-05-31 LAB — URINE MICROSCOPIC-ADD ON: RBC / HPF: NONE SEEN RBC/hpf (ref 0–5)

## 2015-05-31 LAB — PREGNANCY, URINE: PREG TEST UR: NEGATIVE

## 2015-05-31 MED ORDER — ONDANSETRON 4 MG PO TBDP
4.0000 mg | ORAL_TABLET | Freq: Once | ORAL | Status: AC | PRN
  Administered 2015-05-31: 4 mg via ORAL
  Filled 2015-05-31: qty 1

## 2015-05-31 MED ORDER — SODIUM CHLORIDE 0.9 % IV BOLUS (SEPSIS)
1000.0000 mL | Freq: Once | INTRAVENOUS | Status: AC
  Administered 2015-05-31: 1000 mL via INTRAVENOUS

## 2015-05-31 MED ORDER — ONDANSETRON 4 MG PO TBDP: ORAL_TABLET | ORAL | Status: DC

## 2015-05-31 MED ORDER — GI COCKTAIL ~~LOC~~
30.0000 mL | Freq: Once | ORAL | Status: AC
  Administered 2015-05-31: 30 mL via ORAL
  Filled 2015-05-31: qty 30

## 2015-05-31 NOTE — Discharge Instructions (Signed)
1. Medications: zofran, usual home medications 2. Treatment: rest, drink plenty of fluids, advance diet slowly 3. Follow Up: Please followup with your primary doctor in 2 days for discussion of your diagnoses and further evaluation after today's visit; if you do not have a primary care doctor use the resource guide provided to find one; Please return to the ER for persistent vomiting, high fevers or worsening symptoms   Nausea and Vomiting Nausea is a sick feeling that often comes before throwing up (vomiting). Vomiting is a reflex where stomach contents come out of your mouth. Vomiting can cause severe loss of body fluids (dehydration). Children and elderly adults can become dehydrated quickly, especially if they also have diarrhea. Nausea and vomiting are symptoms of a condition or disease. It is important to find the cause of your symptoms. CAUSES   Direct irritation of the stomach lining. This irritation can result from increased acid production (gastroesophageal reflux disease), infection, food poisoning, taking certain medicines (such as nonsteroidal anti-inflammatory drugs), alcohol use, or tobacco use.  Signals from the brain.These signals could be caused by a headache, heat exposure, an inner ear disturbance, increased pressure in the brain from injury, infection, a tumor, or a concussion, pain, emotional stimulus, or metabolic problems.  An obstruction in the gastrointestinal tract (bowel obstruction).  Illnesses such as diabetes, hepatitis, gallbladder problems, appendicitis, kidney problems, cancer, sepsis, atypical symptoms of a heart attack, or eating disorders.  Medical treatments such as chemotherapy and radiation.  Receiving medicine that makes you sleep (general anesthetic) during surgery. DIAGNOSIS Your caregiver may ask for tests to be done if the problems do not improve after a few days. Tests may also be done if symptoms are severe or if the reason for the nausea and  vomiting is not clear. Tests may include:  Urine tests.  Blood tests.  Stool tests.  Cultures (to look for evidence of infection).  X-rays or other imaging studies. Test results can help your caregiver make decisions about treatment or the need for additional tests. TREATMENT You need to stay well hydrated. Drink frequently but in small amounts.You may wish to drink water, sports drinks, clear broth, or eat frozen ice pops or gelatin dessert to help stay hydrated.When you eat, eating slowly may help prevent nausea.There are also some antinausea medicines that may help prevent nausea. HOME CARE INSTRUCTIONS   Take all medicine as directed by your caregiver.  If you do not have an appetite, do not force yourself to eat. However, you must continue to drink fluids.  If you have an appetite, eat a normal diet unless your caregiver tells you differently.  Eat a variety of complex carbohydrates (rice, wheat, potatoes, bread), lean meats, yogurt, fruits, and vegetables.  Avoid high-fat foods because they are more difficult to digest.  Drink enough water and fluids to keep your urine clear or pale yellow.  If you are dehydrated, ask your caregiver for specific rehydration instructions. Signs of dehydration may include:  Severe thirst.  Dry lips and mouth.  Dizziness.  Dark urine.  Decreasing urine frequency and amount.  Confusion.  Rapid breathing or pulse. SEEK IMMEDIATE MEDICAL CARE IF:   You have blood or brown flecks (like coffee grounds) in your vomit.  You have black or bloody stools.  You have a severe headache or stiff neck.  You are confused.  You have severe abdominal pain.  You have chest pain or trouble breathing.  You do not urinate at least once every 8 hours.  You develop cold or clammy skin.  You continue to vomit for longer than 24 to 48 hours.  You have a fever. MAKE SURE YOU:   Understand these instructions.  Will watch your  condition.  Will get help right away if you are not doing well or get worse.   This information is not intended to replace advice given to you by your health care provider. Make sure you discuss any questions you have with your health care provider.   Document Released: 06/07/2005 Document Revised: 08/30/2011 Document Reviewed: 11/04/2010 Elsevier Interactive Patient Education 2016 [employer].    Emergency Department Resource Guide 1) Find a Doctor and Pay Out of Pocket Although you won't have to find out who is covered by your insurance plan, it is a good idea to ask around and get recommendations. You will then need to call the office and see if the doctor you have chosen will accept you as a new patient and what types of options they offer for patients who are self-pay. Some doctors offer discounts or will set up payment plans for their patients who do not have insurance, but you will need to ask so you aren't surprised when you get to your appointment.  2) Contact Your Local Health Department Not all health departments have doctors that can see patients for sick visits, but many do, so it is worth a call to see if yours does. If you don't know where your local health department is, you can check in your phone book. The CDC also has a tool to help you locate your state's health department, and many state websites also have listings of all of their local health departments.  3) Find a Walk-in Clinic If your illness is not likely to be very severe or complicated, you may want to try a walk in clinic. These are popping up all over the country in pharmacies, drugstores, and shopping centers. They're usually staffed by nurse practitioners or physician assistants that have been trained to treat common illnesses and complaints. They're usually fairly quick and inexpensive. However, if you have serious medical issues or chronic medical problems, these are probably not your best option.  No Primary  Care Doctor: - Call Health Connect at  [phone] - they can help you locate a primary care doctor that  accepts your insurance, provides certain services, etc. - Physician Referral Service- [phone]  Chronic Pain Problems: Organization         Address  Phone   Notes  [facility]  [phone] Patients need to be referred by their primary care doctor.   Medication Assistance: Organization         Address  Phone   Notes  [facility] Medication [facility] [address] [place] [place], [place] [address] [phone] --Must be a resident of [facility] -- Must have NO insurance coverage whatsoever (no Medicaid/ Medicare, etc.) -- The pt. MUST have a primary care doctor that directs their care regularly and follows them in the community   [facility]ist  [phone]   [employer]  [phone]    Agencies that provide inexpensive medical care: Organization         Address  Phone   Notes  [facility]  [phone]   [facility]    [phone]   [facility] [address] [place], [place] [address] [phone]   [facility]  [address], [place] [phone]   Planned Parenthood    [phone]   [facility]    [phone]   [place] and [place] [address], [place] Phone:  [phone], Fax:  [phone] Hours of Operation:  9 am - 6 pm, M-F.  Also accepts Medicaid/Medicare and self-pay.  [facility] for [address], [place] Phone: [phone], Fax: [phone]. Hours of Operation:  8:30 am - 5:30 pm, M-F.  Also accepts Medicaid and self-pay.  [facility] [address] [place] Phone: [phone]   [place], [place], [place] [phone], Ext. [phone] Mondays & Thursdays: 7-9 AM.  First 15 patients are seen on a first  come, first serve basis.    [place] Providers:  Organization         Address  Phone   Notes  [facility] [address], [place] [phone] Also accepts self-pay patients.  [facility] [address], [place]  [phone]   [place], [address], [place] [phone]   [facility] [address], [place] [phone]   Lucianne Lei [address], [place]   [phone] Only accepts [place] Access [place] patients after they have their name applied to their card.   Self-Pay (no insurance) in [facility]:  Organization         Address  Phone   Notes  Sickle Cell Patients, [facility] [place] [phone]   [facility] [place] [phone]   [facility] [place]  [place], [place], [place] [phone]   [facility]/Dr. Osei-Bonsu  [address] [place] or [address], [place] [phone] Phone number for both [place] and [place] locations is the same.  [facility] [address], [place] [phone]   [facility] [address], [place] or [address] [phone]   [facility] [address] [place] [phone], phone; [phone], fax Sees patients 1st and 3rd Saturday of every month.  Must not qualify for public or private insurance (i.e. Medicaid, Medicare, [place] Health Choice, Veterans' Benefits)  Household income should be no more than 200% of the poverty level The clinic cannot treat you if you are pregnant or think you are pregnant  Sexually transmitted diseases are not treated at the clinic.    Dental Care: Organization          Address  Phone  Notes  [facility] Department of [facility] [phone] Accepts children up to age 64 who are enrolled in [place] or [place]; pregnant women with a Medicaid card; and children who have applied for Medicaid or [place] Health Choice, but were declined, whose parents can pay a reduced fee at time of service.  [facility] Department of [facility]  [address], [place] [phone] Accepts children up to age 64 who are enrolled in [place] or [place]; pregnant women with a Medicaid card; and children who have applied  for Medicaid or [place] Health Choice, but were declined, whose parents can pay a reduced fee at time of service.  [place] Adult Dental Access PROGRAM  [place] [phone] Patients are seen by appointment only. Walk-ins are not accepted. [place] will see patients 16 years of age and older. Monday - Tuesday (8am-5pm) Most Wednesdays (8:30-5pm) $30 per visit, cash only  [facility] Adult Dental Access PROGRAM  [address], [facility] [phone] Patients are seen by appointment only. Walk-ins are not accepted. [place] will see patients 26 years of age and older. One Wednesday Evening (Monthly: Volunteer Based).  $30 per visit, cash only  [place]  [phone] for adults; Children under age 53, call Graduate Pediatric Dentistry at [phone]. Children aged 4-14, please call [phone] to request a pediatric application.  Dental services are provided in all areas of dental care including fillings, crowns and bridges, complete and partial dentures, implants, gum treatment, root canals, and extractions. Preventive care is also provided. Treatment is provided to both adults and children. Patients are selected via a lottery and there is often a waiting list.   [facility] [address] [place]  [phone] [email or web address]   [facility] [address] [place], [place] [phone], Ext. [phone] Second and Fourth Thursday of each month, opens at 6:30 AM; Clinic ends at 9 AM.  Patients are seen on a first-come first-served basis, and a limited number are seen during each clinic.   [facility]  [address] Hillard Danker [place], [place] [phone]   Eligibility Requirements You must have lived in [place], [place], or [place] counties for at least the last three months.   You cannot be eligible for state or federal sponsored [employer], including [employer], [place], or [employer].   You generally cannot be eligible for healthcare insurance through your employer.    How to apply: Eligibility screenings are held every Tuesday and Wednesday afternoon from 1:00 pm until 4:00 pm. You do not need an appointment for the interview!  [facility] [address] [place], [place]   [place]  [place] Department  [place]  [phone]    Behavioral Health Resources in the Community: Intensive Outpatient Programs Organization         Address  Phone  Notes  [place] [address], [place], [place] [phone]   [facility] [address] [place], [place]   ADS: [facility] [address] [place], [place]   [place] [address]  [place], [place] or [phone]   Substance Abuse Resources Organization         Address  Phone  Notes  Alcohol and Drug Services  [phone]   [place]  [phone]   [employer] [facility]   Chinita Pester  [phone]   Residential & Outpatient Substance Abuse Program  [phone]   Psychological  Services Organization         Address  Phone  Notes  [facility]  [place]  [phone]   [place] [address] [place] or [phone]    Mobile Crisis Teams Organization  Address  Phone  Notes  [facility], Mobile Crisis Care Unit  [phone]   [facility]  [address] [place], [place]   [facility] [address] [place] [phone]    Self-Help/Support Groups Organization         Address  Phone             Notes  [place]. of [place] - variety of support groups  [place] Call for more information  Narcotics Anonymous (NA), [facility] [address], [employer] [place]  2 meetings at this location   Special educational needs teacher         Address  Phone  Notes  [facility] [place],    [place]  [phone]   [facility]  [address], [place] [phone], [place], [place]   [place], [place] [phone] Admissions: 8am-3pm M-F  Incentives Substance [place] [address],    [place], [place] [phone]   [facility] [address] [place], [place], [place]   [facility] [address],  [place], [place]   [facility] - Intensive Outpatient [address]een [address], [place], [place]   [facility] [place]  [place], [place] [phone] or [phone]   [facility] ([facility]) [address] [place], [place] Accepts Medicaid  [facility] [place] [address],  [place] [phone] Substance Abuse/Addiction Treatment   [facility] Organization         Address  Phone  Notes  [facility]  [phone]   Domenic Schwab, PhD [address] Arlis Porta [place], [place]   [phone] or [phone]   [place], [place] [phone]   [facility] [address] [place], [place] [phone] Insurance/Medicaid/sponsorship through [facility] [address] [place]                                    [place], [place] [phone] [place] [address], [place] [phone]    Dr. Adele Schilder  [phone]   [facility] Dept. 1) [address] [place] 2) [place] 3)  [place] [address], [place] [phone]  [phone]   [place] [phone] or [phone] (After Hours)

## 2015-05-31 NOTE — ED Notes (Signed)
Patient stable and ambulatory.  Patient verbalizes understanding of discharge medications, instructions and follow-up. 

## 2015-05-31 NOTE — ED Notes (Signed)
Pt states woke up this morning vomiting with epigastric pain.  Pt denies any blood in vomit, known fever or other symptoms.
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
## 2015-06-03 LAB — URINE CULTURE: Culture: 60000

## 2015-06-04 ENCOUNTER — Telehealth (HOSPITAL_BASED_OUTPATIENT_CLINIC_OR_DEPARTMENT_OTHER): Payer: Self-pay | Admitting: Emergency Medicine

## 2015-06-04 NOTE — Progress Notes (Signed)
ED Antimicrobial Stewardship Positive Culture Follow Up   Lindsey Carlson is an 34 y.o. female who presented to [facility] on 05/31/2015 with a chief complaint of  Chief Complaint  Patient presents with  . Emesis    Recent Results (from the past 720 hour(s))  Urine culture     Status: None   Collection Time: 05/31/15  1:30 PM  Result Value Ref Range Status   Specimen Description URINE, CLEAN CATCH  Final   Special Requests NONE  Final   Culture   Final    60,000 COLONIES/ml ESCHERICHIA COLI Performed at [facility]    Report Status 06/03/2015 FINAL  Final   Organism ID, Bacteria ESCHERICHIA COLI  Final      Susceptibility   Escherichia coli - MIC*    AMPICILLIN 4 SENSITIVE Sensitive     CEFAZOLIN <=4 SENSITIVE Sensitive     CEFTRIAXONE <=1 SENSITIVE Sensitive     CIPROFLOXACIN <=0.25 SENSITIVE Sensitive     GENTAMICIN <=1 SENSITIVE Sensitive     IMIPENEM <=0.25 SENSITIVE Sensitive     NITROFURANTOIN <=16 SENSITIVE Sensitive     TRIMETH/SULFA <=20 SENSITIVE Sensitive     AMPICILLIN/SULBACTAM <=2 SENSITIVE Sensitive     PIP/TAZO <=4 SENSITIVE Sensitive     * 60,000 COLONIES/ml ESCHERICHIA COLI    No UTI signs or symptoms. Asymptomatic bacteruria  Plan: No treatment necessary  ED Provider: Arthor Captain PA-C   Armandina Stammer 06/04/2015, 8:49 AM Infectious Diseases Pharmacist Phone# [phone]

## 2015-06-04 NOTE — Telephone Encounter (Signed)
Post ED Visit - Positive Culture Follow-up  Culture report reviewed by antimicrobial stewardship pharmacist:  [x]  , Pharm.D. []  Enzo Bi, Pharm.D., BCPS []  , Pharm.D. []  Celedonio Miyamoto, Pharm.D., BCPS []  [place], Garvin Fila.D., BCPS, AAHIVP []  , Pharm.D., BCPS, AAHIVP []  Georgina Pillion, .D. []  [place], [place].D.  Positive urine culture E. coli Treated with none, asymptomatic,no further patient follow-up is required at this time.  06/04/2015, 11:52 AM

## 2015-10-16 ENCOUNTER — Emergency Department (HOSPITAL_BASED_OUTPATIENT_CLINIC_OR_DEPARTMENT_OTHER)
Admission: EM | Admit: 2015-10-16 | Discharge: 2015-10-17 | Disposition: A | Discharge status: Home / Self Care | Payer: Self-pay | Attending: Emergency Medicine | Admitting: Emergency Medicine

## 2015-10-16 ENCOUNTER — Encounter (HOSPITAL_BASED_OUTPATIENT_CLINIC_OR_DEPARTMENT_OTHER): Payer: Self-pay | Admitting: *Deleted

## 2015-10-16 DIAGNOSIS — M5432 Sciatica, left side: Secondary | ICD-10-CM

## 2015-10-16 DIAGNOSIS — G8929 Other chronic pain: Secondary | ICD-10-CM | POA: Insufficient documentation

## 2015-10-16 DIAGNOSIS — M5442 Lumbago with sciatica, left side: Secondary | ICD-10-CM | POA: Insufficient documentation

## 2015-10-16 MED ORDER — HYDROCODONE-ACETAMINOPHEN 5-325 MG PO TABS: 1.0000 | ORAL_TABLET | Freq: Four times a day (QID) | ORAL | Status: DC | PRN

## 2015-10-16 NOTE — ED Notes (Signed)
Pt c/o left sided back pain which radiates down left leg

## 2015-10-16 NOTE — ED Notes (Signed)
MD at bedside. 
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
## 2015-10-22 ENCOUNTER — Emergency Department (HOSPITAL_BASED_OUTPATIENT_CLINIC_OR_DEPARTMENT_OTHER)
Admission: EM | Admit: 2015-10-22 | Discharge: 2015-10-22 | Disposition: A | Discharge status: Home / Self Care | Payer: Self-pay | Attending: Emergency Medicine | Admitting: Emergency Medicine

## 2015-10-22 ENCOUNTER — Encounter (HOSPITAL_BASED_OUTPATIENT_CLINIC_OR_DEPARTMENT_OTHER): Payer: Self-pay

## 2015-10-22 DIAGNOSIS — N390 Urinary tract infection, site not specified: Secondary | ICD-10-CM | POA: Insufficient documentation

## 2015-10-22 DIAGNOSIS — M5432 Sciatica, left side: Secondary | ICD-10-CM | POA: Insufficient documentation

## 2015-10-22 HISTORY — DX: Dorsalgia, unspecified: M54.9

## 2015-10-22 HISTORY — DX: Other chronic pain: G89.29

## 2015-10-22 LAB — URINALYSIS, ROUTINE W REFLEX MICROSCOPIC
BILIRUBIN URINE: NEGATIVE
GLUCOSE, UA: NEGATIVE mg/dL
HGB URINE DIPSTICK: NEGATIVE
Ketones, ur: NEGATIVE mg/dL
Nitrite: NEGATIVE
PH: 6 (ref 5.0–8.0)
Protein, ur: NEGATIVE mg/dL
SPECIFIC GRAVITY, URINE: 1.024 (ref 1.005–1.030)

## 2015-10-22 LAB — URINE MICROSCOPIC-ADD ON

## 2015-10-22 LAB — PREGNANCY, URINE: Preg Test, Ur: NEGATIVE

## 2015-10-22 MED ORDER — METHOCARBAMOL 500 MG PO TABS: 500.0000 mg | ORAL_TABLET | Freq: Two times a day (BID) | ORAL | Status: DC

## 2015-10-22 MED ORDER — NAPROXEN 500 MG PO TABS: 500.0000 mg | ORAL_TABLET | Freq: Two times a day (BID) | ORAL | Status: DC

## 2015-10-22 MED ORDER — CEPHALEXIN 500 MG PO CAPS: 500.0000 mg | ORAL_CAPSULE | Freq: Four times a day (QID) | ORAL | Status: DC

## 2015-10-22 MED ORDER — NITROFURANTOIN MONOHYD MACRO 100 MG PO CAPS
100.0000 mg | ORAL_CAPSULE | Freq: Once | ORAL | Status: AC
  Administered 2015-10-22: 100 mg via ORAL
  Filled 2015-10-22: qty 1

## 2015-10-22 MED ORDER — PHENAZOPYRIDINE HCL 200 MG PO TABS: 200.0000 mg | ORAL_TABLET | Freq: Three times a day (TID) | ORAL | Status: DC

## 2015-10-22 MED ORDER — PHENAZOPYRIDINE HCL 100 MG PO TABS
200.0000 mg | ORAL_TABLET | Freq: Once | ORAL | Status: AC
  Administered 2015-10-22: 200 mg via ORAL
  Filled 2015-10-22: qty 2
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
## 2015-10-22 NOTE — ED Notes (Signed)
Pt c/o chronic back pain, now having lower back pain radiating down lt leg. Pt c/o neck pain, states fell yesterday

## 2015-10-22 NOTE — ED Notes (Signed)
Pt verbalizes understanding of d/c instructions and denies any further needs at this time. 

## 2015-10-22 NOTE — ED Notes (Signed)
Pt c/o worse left lower back pain than visit on 4/27.  She was feeling better yesterday and went to the gym and swam and played basketball and fell on her butt and is more sore today.  She states she has been taking the ibuprofen, last dose at 2100, still has norco left bc she is not taking that.  She denies incontinence and denies groin numbness.

## 2015-11-26 ENCOUNTER — Emergency Department (HOSPITAL_BASED_OUTPATIENT_CLINIC_OR_DEPARTMENT_OTHER)
Admission: EM | Admit: 2015-11-26 | Discharge: 2015-11-26 | Disposition: A | Discharge status: Home / Self Care | Payer: Self-pay | Attending: Emergency Medicine | Admitting: Emergency Medicine

## 2015-11-26 ENCOUNTER — Encounter (HOSPITAL_BASED_OUTPATIENT_CLINIC_OR_DEPARTMENT_OTHER): Payer: Self-pay | Admitting: Emergency Medicine

## 2015-11-26 DIAGNOSIS — N3090 Cystitis, unspecified without hematuria: Secondary | ICD-10-CM | POA: Insufficient documentation

## 2015-11-26 LAB — URINALYSIS, ROUTINE W REFLEX MICROSCOPIC
GLUCOSE, UA: NEGATIVE mg/dL
HGB URINE DIPSTICK: NEGATIVE
KETONES UR: 15 mg/dL — AB
NITRITE: NEGATIVE
PH: 5.5 (ref 5.0–8.0)
Protein, ur: NEGATIVE mg/dL
Specific Gravity, Urine: 1.028 (ref 1.005–1.030)

## 2015-11-26 LAB — URINE MICROSCOPIC-ADD ON

## 2015-11-26 LAB — PREGNANCY, URINE: Preg Test, Ur: NEGATIVE

## 2015-11-26 MED ORDER — PHENAZOPYRIDINE HCL 200 MG PO TABS: 200.0000 mg | ORAL_TABLET | Freq: Three times a day (TID) | ORAL | Status: DC

## 2015-11-26 MED ORDER — ONDANSETRON HCL 4 MG PO TABS: 4.0000 mg | ORAL_TABLET | Freq: Four times a day (QID) | ORAL | Status: DC

## 2015-11-26 MED ORDER — ACETAMINOPHEN 500 MG PO TABS
1000.0000 mg | ORAL_TABLET | Freq: Once | ORAL | Status: AC
  Administered 2015-11-26: 1000 mg via ORAL
  Filled 2015-11-26: qty 2

## 2015-11-26 MED ORDER — ONDANSETRON 4 MG PO TBDP
4.0000 mg | ORAL_TABLET | Freq: Once | ORAL | Status: AC
  Administered 2015-11-26: 4 mg via ORAL
  Filled 2015-11-26: qty 1

## 2015-11-26 MED ORDER — IBUPROFEN 800 MG PO TABS
800.0000 mg | ORAL_TABLET | Freq: Once | ORAL | Status: AC
  Administered 2015-11-26: 800 mg via ORAL
  Filled 2015-11-26: qty 1

## 2015-11-26 MED ORDER — PHENAZOPYRIDINE HCL 100 MG PO TABS
95.0000 mg | ORAL_TABLET | Freq: Once | ORAL | Status: AC
  Administered 2015-11-26: 100 mg via ORAL
  Filled 2015-11-26: qty 1

## 2015-11-26 MED ORDER — CEPHALEXIN 500 MG PO CAPS: 500.0000 mg | ORAL_CAPSULE | Freq: Four times a day (QID) | ORAL | Status: DC

## 2015-11-26 NOTE — ED Notes (Signed)
Dysuria with pressure.  Some blood in urine since Sunday.
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
## 2015-11-26 NOTE — Discharge Instructions (Signed)

## 2015-11-26 NOTE — ED Notes (Signed)
Patient attempting to obtain urine sample at this time.  

## 2016-12-02 DIAGNOSIS — B009 Herpesviral infection, unspecified: Secondary | ICD-10-CM | POA: Insufficient documentation

## 2017-07-31 ENCOUNTER — Emergency Department (HOSPITAL_BASED_OUTPATIENT_CLINIC_OR_DEPARTMENT_OTHER)
Admission: EM | Admit: 2017-07-31 | Discharge: 2017-07-31 | Disposition: A | Discharge status: Home / Self Care | Payer: Self-pay | Attending: Emergency Medicine | Admitting: Emergency Medicine

## 2017-07-31 ENCOUNTER — Other Ambulatory Visit: Payer: Self-pay

## 2017-07-31 ENCOUNTER — Encounter (HOSPITAL_BASED_OUTPATIENT_CLINIC_OR_DEPARTMENT_OTHER): Payer: Self-pay | Admitting: *Deleted

## 2017-07-31 DIAGNOSIS — J039 Acute tonsillitis, unspecified: Secondary | ICD-10-CM

## 2017-07-31 LAB — RAPID STREP SCREEN (MED CTR MEBANE ONLY): STREPTOCOCCUS, GROUP A SCREEN (DIRECT): NEGATIVE

## 2017-07-31 MED ORDER — PREDNISONE 10 MG PO TABS: 20.0000 mg | ORAL_TABLET | Freq: Two times a day (BID) | ORAL | 0 refills | Status: DC

## 2017-07-31 MED ORDER — CEPHALEXIN 500 MG PO CAPS: 500.0000 mg | ORAL_CAPSULE | Freq: Four times a day (QID) | ORAL | 0 refills | Status: DC

## 2017-07-31 NOTE — ED Notes (Signed)
EDP into room 

## 2017-07-31 NOTE — ED Triage Notes (Signed)
C/o sore throat, L>R, ongoing for ~ 1 week. Admits to "NV and fever last week, but these sx have resolved". Some cough. Reports "coughing up white balls that smell". H/o similar. No relief with tylenol cold chloraseptic or lozenges. Rates pain 7/10.

## 2017-07-31 NOTE — Discharge Instructions (Signed)
Keflex and prednisone as prescribed.  Ibuprofen 600 mg every 6 hours as needed for pain.  Follow-up with her primary doctor if symptoms are not improving in the next week.
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
## 2017-07-31 NOTE — ED Notes (Signed)
Alert, NAD, calm, interactive, resps e/u, speaking in clear complete sentences, no dyspnea noted, skin W&D, VSS, c/o sore throat, (denies: NVD, fever, cough, congestion, dizziness or visual changes). Family (a minor) at [facility].

## 2017-08-02 LAB — CULTURE, GROUP A STREP (THRC)

## 2017-08-29 ENCOUNTER — Emergency Department (HOSPITAL_BASED_OUTPATIENT_CLINIC_OR_DEPARTMENT_OTHER)
Admission: EM | Admit: 2017-08-29 | Discharge: 2017-08-29 | Disposition: A | Discharge status: Home / Self Care | Payer: Self-pay | Attending: Emergency Medicine | Admitting: Emergency Medicine

## 2017-08-29 ENCOUNTER — Encounter (HOSPITAL_BASED_OUTPATIENT_CLINIC_OR_DEPARTMENT_OTHER): Payer: Self-pay | Admitting: *Deleted

## 2017-08-29 ENCOUNTER — Emergency Department (HOSPITAL_BASED_OUTPATIENT_CLINIC_OR_DEPARTMENT_OTHER): Payer: Self-pay

## 2017-08-29 ENCOUNTER — Other Ambulatory Visit: Payer: Self-pay

## 2017-08-29 DIAGNOSIS — R1084 Generalized abdominal pain: Secondary | ICD-10-CM | POA: Insufficient documentation

## 2017-08-29 DIAGNOSIS — R109 Unspecified abdominal pain: Secondary | ICD-10-CM

## 2017-08-29 LAB — COMPREHENSIVE METABOLIC PANEL
ALBUMIN: 4.4 g/dL (ref 3.5–5.0)
ALK PHOS: 62 U/L (ref 38–126)
ALT: 15 U/L (ref 14–54)
AST: 21 U/L (ref 15–41)
Anion gap: 9 (ref 5–15)
BUN: 8 mg/dL (ref 6–20)
CALCIUM: 9 mg/dL (ref 8.9–10.3)
CHLORIDE: 103 mmol/L (ref 101–111)
CO2: 22 mmol/L (ref 22–32)
CREATININE: 0.7 mg/dL (ref 0.44–1.00)
GFR calc Af Amer: >60 mL/min (ref >60)
GFR calc non Af Amer: >60 mL/min (ref >60)
GLUCOSE: 93 mg/dL (ref 65–99)
Potassium: 3.3 mmol/L — ABNORMAL LOW (ref 3.5–5.1)
SODIUM: 134 mmol/L — AB (ref 135–145)
Total Bilirubin: 0.3 mg/dL (ref 0.3–1.2)
Total Protein: 8.3 g/dL — ABNORMAL HIGH (ref 6.5–8.1)

## 2017-08-29 LAB — CBC WITH DIFFERENTIAL/PLATELET
BASOS ABS: 0 10*3/uL (ref 0.0–0.1)
BASOS PCT: 0 %
EOS ABS: 0 10*3/uL (ref 0.0–0.7)
Eosinophils Relative: 1 %
HCT: 39.6 % (ref 36.0–46.0)
HEMOGLOBIN: 13.3 g/dL (ref 12.0–15.0)
Lymphocytes Relative: 35 %
Lymphs Abs: 3 10*3/uL (ref 0.7–4.0)
MCH: 28.1 pg (ref 26.0–34.0)
MCHC: 33.6 g/dL (ref 30.0–36.0)
MCV: 83.7 fL (ref 78.0–100.0)
Monocytes Absolute: 0.4 10*3/uL (ref 0.1–1.0)
Monocytes Relative: 5 %
NEUTROS PCT: 59 %
Neutro Abs: 5 10*3/uL (ref 1.7–7.7)
PLATELETS: 288 10*3/uL (ref 150–400)
RBC: 4.73 MIL/uL (ref 3.87–5.11)
RDW: 13.4 % (ref 11.5–15.5)
WBC: 8.4 10*3/uL (ref 4.0–10.5)

## 2017-08-29 LAB — URINALYSIS, ROUTINE W REFLEX MICROSCOPIC
Bilirubin Urine: NEGATIVE
Glucose, UA: NEGATIVE mg/dL
Hgb urine dipstick: NEGATIVE
KETONES UR: NEGATIVE mg/dL
LEUKOCYTES UA: NEGATIVE
NITRITE: NEGATIVE
Protein, ur: NEGATIVE mg/dL
Specific Gravity, Urine: 1.025 (ref 1.005–1.030)
pH: 5 (ref 5.0–8.0)

## 2017-08-29 LAB — LIPASE, BLOOD: Lipase: 41 U/L (ref 11–51)

## 2017-08-29 LAB — PREGNANCY, URINE: PREG TEST UR: NEGATIVE

## 2017-08-29 MED ORDER — ONDANSETRON HCL 4 MG/2ML IJ SOLN
4.0000 mg | Freq: Once | INTRAMUSCULAR | Status: AC
  Administered 2017-08-29: 4 mg via INTRAVENOUS
  Filled 2017-08-29: qty 2

## 2017-08-29 MED ORDER — MORPHINE SULFATE (PF) 4 MG/ML IV SOLN
4.0000 mg | Freq: Once | INTRAVENOUS | Status: AC
  Administered 2017-08-29: 4 mg via INTRAVENOUS
  Filled 2017-08-29: qty 1
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
## 2017-08-29 NOTE — ED Triage Notes (Signed)
Right flank pain x 3 hours. Nausea.

## 2017-08-29 NOTE — Discharge Instructions (Signed)
Please take Ibuprofen (Advil, motrin) and Tylenol (acetaminophen) to relieve your pain.  You may take up to 600 MG (3 pills) of normal strength ibuprofen every 8 hours as needed.  In between doses of ibuprofen you make take tylenol, up to 1,000 mg (two extra strength pills).  Do not take more than 3,000 mg tylenol in a 24 hour period.  Please check all medication labels as many medications such as pain and cold medications may contain tylenol.  Do not drink alcohol while taking these medications.  Do not take other NSAID'S while taking ibuprofen (such as aleve or naproxen).  Please take ibuprofen with food to decrease stomach upset.  Please make sure you are staying well hydrated.  You develop fevers, or unable to keep down fluids, develop diarrhea, or have any concerns then please seek additional medical evaluation.  Today you received medications that may make you sleepy or impair your ability to make decisions.  For the next 24 hours please do not drive, operate heavy machinery, care for a small child with out another adult present, or perform any activities that may cause harm to you or someone else if you were to fall asleep or be impaired.

## 2017-08-31 LAB — URINE CULTURE: CULTURE: NO GROWTH

## 2018-05-11 DIAGNOSIS — G5621 Lesion of ulnar nerve, right upper limb: Secondary | ICD-10-CM | POA: Insufficient documentation

## 2018-09-04 DIAGNOSIS — J3501 Chronic tonsillitis: Secondary | ICD-10-CM | POA: Insufficient documentation

## 2018-09-04 DIAGNOSIS — J309 Allergic rhinitis, unspecified: Secondary | ICD-10-CM | POA: Insufficient documentation

## 2019-02-10 ENCOUNTER — Other Ambulatory Visit: Payer: Self-pay

## 2019-02-10 ENCOUNTER — Emergency Department (HOSPITAL_BASED_OUTPATIENT_CLINIC_OR_DEPARTMENT_OTHER)
Admission: EM | Admit: 2019-02-10 | Discharge: 2019-02-10 | Disposition: A | Discharge status: Home / Self Care | Payer: Medicaid Other | Attending: Emergency Medicine | Admitting: Emergency Medicine

## 2019-02-10 ENCOUNTER — Encounter (HOSPITAL_BASED_OUTPATIENT_CLINIC_OR_DEPARTMENT_OTHER): Payer: Self-pay | Admitting: Emergency Medicine

## 2019-02-10 DIAGNOSIS — H53141 Visual discomfort, right eye: Secondary | ICD-10-CM | POA: Insufficient documentation

## 2019-02-10 DIAGNOSIS — H5711 Ocular pain, right eye: Secondary | ICD-10-CM | POA: Insufficient documentation

## 2019-02-10 MED ORDER — FLUORESCEIN SODIUM 1 MG OP STRP
1.0000 | ORAL_STRIP | Freq: Once | OPHTHALMIC | Status: DC
  Filled 2019-02-10: qty 1

## 2019-02-10 MED ORDER — ERYTHROMYCIN 5 MG/GM OP OINT: TOPICAL_OINTMENT | OPHTHALMIC | 0 refills | Status: DC

## 2019-02-10 MED ORDER — TETRACAINE HCL 0.5 % OP SOLN
2.0000 [drp] | Freq: Once | OPHTHALMIC | Status: DC
  Filled 2019-02-10: qty 4

## 2019-02-10 NOTE — Discharge Instructions (Addendum)
You were seen in the emergency department for right eye pain and irritation.  We did not see any signs of a foreign body or corneal abrasion.  We are prescribing you some eye ointment to help soothe the eye.  You can use it 4 times a day for the next 2 days.  Please return if any worsening symptoms.
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
## 2019-02-10 NOTE — ED Triage Notes (Signed)
Woke up with right eye pain today, with right sided facial pain. Photophobia

## 2019-04-12 ENCOUNTER — Telehealth: Payer: Self-pay | Admitting: Nurse Practitioner

## 2019-04-12 NOTE — Telephone Encounter (Signed)

## 2019-04-13 ENCOUNTER — Ambulatory Visit: Payer: BC Managed Care – PPO | Admitting: Nurse Practitioner

## 2019-04-19 ENCOUNTER — Telehealth: Payer: Self-pay | Admitting: Nurse Practitioner

## 2019-04-19 NOTE — Telephone Encounter (Signed)

## 2019-04-20 ENCOUNTER — Ambulatory Visit: Payer: BC Managed Care – PPO | Admitting: Nurse Practitioner

## 2019-04-24 ENCOUNTER — Other Ambulatory Visit (HOSPITAL_COMMUNITY)
Admission: RE | Admit: 2019-04-24 | Discharge: 2019-04-24 | Disposition: A | Discharge status: Home / Self Care | Payer: BC Managed Care – PPO | Source: Ambulatory Visit | Attending: Nurse Practitioner | Admitting: Nurse Practitioner

## 2019-04-24 ENCOUNTER — Ambulatory Visit (INDEPENDENT_AMBULATORY_CARE_PROVIDER_SITE_OTHER): Payer: BC Managed Care – PPO

## 2019-04-24 ENCOUNTER — Encounter: Payer: Self-pay | Admitting: Nurse Practitioner

## 2019-04-24 ENCOUNTER — Ambulatory Visit (INDEPENDENT_AMBULATORY_CARE_PROVIDER_SITE_OTHER): Payer: BC Managed Care – PPO | Admitting: Nurse Practitioner

## 2019-04-24 ENCOUNTER — Other Ambulatory Visit: Payer: Self-pay

## 2019-04-24 VITALS — BP 116/86 | HR 72 | Temp 97.0°F | Ht 61.0 in | Wt 153.6 lb

## 2019-04-24 DIAGNOSIS — Z803 Family history of malignant neoplasm of breast: Secondary | ICD-10-CM | POA: Diagnosis not present

## 2019-04-24 DIAGNOSIS — R202 Paresthesia of skin: Secondary | ICD-10-CM

## 2019-04-24 DIAGNOSIS — M79609 Pain in unspecified limb: Secondary | ICD-10-CM

## 2019-04-24 DIAGNOSIS — N644 Mastodynia: Secondary | ICD-10-CM | POA: Diagnosis not present

## 2019-04-24 DIAGNOSIS — Z113 Encounter for screening for infections with a predominantly sexual mode of transmission: Secondary | ICD-10-CM | POA: Diagnosis not present

## 2019-04-24 DIAGNOSIS — Z23 Encounter for immunization: Secondary | ICD-10-CM | POA: Diagnosis not present

## 2019-04-24 DIAGNOSIS — R768 Other specified abnormal immunological findings in serum: Secondary | ICD-10-CM

## 2019-04-24 NOTE — Patient Instructions (Addendum)
Lumbar spine x-ray done 2019: normal. Normal cervical spine Normal breast exam today. You are due for mammogram 06/2019 Take ibuprofen 400mg  every 8hrs as needed with food for breast pain. May also use cold compress on breast. Avoid caffeine intake.  You will be contacted to schedule appt with genetic counselor.  Negative HIV, Chlamydia, GC and trichomonasis, B12 and folate, TSH, and BMP. Mild decrease in iron panel. Need to start multivitamin with iron 1tab daily OTC with food. Positive ANA with speckled pattern. With current symptoms and your family history, I entered referral to rheumatology. In the mean time, start meloxicam 1tab daily with food. rx sent
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
## 2019-04-25 LAB — B12 AND FOLATE PANEL
Folate: >24.1 ng/mL (ref >5.9)
Vitamin B-12: 354 pg/mL (ref 211–911)

## 2019-04-25 LAB — TSH: TSH: 2.64 u[IU]/mL (ref 0.35–4.50)

## 2019-04-26 ENCOUNTER — Telehealth: Payer: Self-pay | Admitting: Genetic Counselor

## 2019-04-26 LAB — BASIC METABOLIC PANEL
BUN: 9 mg/dL (ref 7–25)
CO2: 24 mmol/L (ref 20–32)
Calcium: 9.8 mg/dL (ref 8.6–10.2)
Chloride: 103 mmol/L (ref 98–110)
Creat: 0.75 mg/dL (ref 0.50–1.10)
Glucose, Bld: 93 mg/dL (ref 65–99)
Potassium: 4 mmol/L (ref 3.5–5.3)
Sodium: 139 mmol/L (ref 135–146)

## 2019-04-26 LAB — IRON,TIBC AND FERRITIN PANEL
%SAT: 14 % (calc) — ABNORMAL LOW (ref 16–45)
Ferritin: 10 ng/mL — ABNORMAL LOW (ref 16–154)
Iron: 59 ug/dL (ref 40–190)
TIBC: 425 mcg/dL (calc) (ref 250–450)

## 2019-04-26 LAB — ANA: Anti Nuclear Antibody (ANA): POSITIVE — AB

## 2019-04-26 LAB — URINE CYTOLOGY ANCILLARY ONLY
Chlamydia: NEGATIVE
Comment: NEGATIVE
Comment: NEGATIVE
Comment: NORMAL
Neisseria Gonorrhea: NEGATIVE
Trichomonas: NEGATIVE

## 2019-04-26 LAB — HIV ANTIBODY (ROUTINE TESTING W REFLEX): HIV 1&2 Ab, 4th Generation: NONREACTIVE

## 2019-04-26 LAB — ANTI-NUCLEAR AB-TITER (ANA TITER): ANA Titer 1: 1:40 {titer} — ABNORMAL HIGH

## 2019-04-26 NOTE — Telephone Encounter (Signed)
A genetic counseling appt has been scheduled for Lindsey Carlson to see Clint Guy on 31/51 at 3pm. Pt aware to arrive 15 minutes early.

## 2019-04-28 ENCOUNTER — Encounter: Payer: Self-pay | Admitting: Nurse Practitioner

## 2019-04-28 MED ORDER — MELOXICAM 7.5 MG PO TABS: 7.5000 mg | ORAL_TABLET | Freq: Every day | ORAL | 1 refills | Status: DC

## 2019-05-04 DIAGNOSIS — M533 Sacrococcygeal disorders, not elsewhere classified: Secondary | ICD-10-CM | POA: Diagnosis not present

## 2019-05-04 DIAGNOSIS — M549 Dorsalgia, unspecified: Secondary | ICD-10-CM | POA: Diagnosis not present

## 2019-05-04 DIAGNOSIS — M79672 Pain in left foot: Secondary | ICD-10-CM | POA: Diagnosis not present

## 2019-05-04 DIAGNOSIS — R768 Other specified abnormal immunological findings in serum: Secondary | ICD-10-CM | POA: Diagnosis not present

## 2019-05-04 DIAGNOSIS — M79642 Pain in left hand: Secondary | ICD-10-CM | POA: Diagnosis not present

## 2019-05-04 DIAGNOSIS — M47816 Spondylosis without myelopathy or radiculopathy, lumbar region: Secondary | ICD-10-CM | POA: Diagnosis not present

## 2019-05-04 DIAGNOSIS — M7989 Other specified soft tissue disorders: Secondary | ICD-10-CM | POA: Diagnosis not present

## 2019-05-04 DIAGNOSIS — M79643 Pain in unspecified hand: Secondary | ICD-10-CM | POA: Diagnosis not present

## 2019-05-04 DIAGNOSIS — M542 Cervicalgia: Secondary | ICD-10-CM | POA: Diagnosis not present

## 2019-05-04 DIAGNOSIS — M79671 Pain in right foot: Secondary | ICD-10-CM | POA: Diagnosis not present

## 2019-05-04 DIAGNOSIS — M47814 Spondylosis without myelopathy or radiculopathy, thoracic region: Secondary | ICD-10-CM | POA: Diagnosis not present

## 2019-05-04 DIAGNOSIS — M79641 Pain in right hand: Secondary | ICD-10-CM | POA: Diagnosis not present

## 2019-05-04 DIAGNOSIS — R202 Paresthesia of skin: Secondary | ICD-10-CM | POA: Diagnosis not present

## 2019-05-09 ENCOUNTER — Telehealth: Payer: Self-pay | Admitting: Genetic Counselor

## 2019-05-09 NOTE — Telephone Encounter (Signed)
Returned patient's phone call regarding rescheduling 11/19 appointment, per patient's request appointment has been rescheduled to 11/30. Walk-in visit.

## 2019-05-10 ENCOUNTER — Inpatient Hospital Stay: Payer: BC Managed Care – PPO

## 2019-05-10 ENCOUNTER — Inpatient Hospital Stay: Payer: BC Managed Care – PPO | Admitting: Genetic Counselor

## 2019-05-11 ENCOUNTER — Ambulatory Visit: Payer: BC Managed Care – PPO | Admitting: Nurse Practitioner

## 2019-05-11 ENCOUNTER — Telehealth: Payer: Self-pay | Admitting: Nurse Practitioner

## 2019-05-11 NOTE — Telephone Encounter (Signed)

## 2019-05-14 ENCOUNTER — Ambulatory Visit: Payer: BC Managed Care – PPO | Admitting: Nurse Practitioner

## 2019-05-14 DIAGNOSIS — R768 Other specified abnormal immunological findings in serum: Secondary | ICD-10-CM | POA: Diagnosis not present

## 2019-05-18 ENCOUNTER — Telehealth: Payer: Self-pay | Admitting: Genetic Counselor

## 2019-05-18 NOTE — Telephone Encounter (Signed)
Called patient regarding upcoming 11/30 appointment, patient would prefer this to be a walk-in visit.

## 2019-05-21 ENCOUNTER — Other Ambulatory Visit: Payer: Self-pay | Admitting: Genetic Counselor

## 2019-05-21 ENCOUNTER — Other Ambulatory Visit: Payer: Self-pay

## 2019-05-21 ENCOUNTER — Encounter: Payer: Self-pay | Admitting: Genetic Counselor

## 2019-05-21 ENCOUNTER — Inpatient Hospital Stay: Payer: BC Managed Care – PPO

## 2019-05-21 ENCOUNTER — Inpatient Hospital Stay: Payer: BC Managed Care – PPO | Attending: Genetic Counselor | Admitting: Genetic Counselor

## 2019-05-21 DIAGNOSIS — Z803 Family history of malignant neoplasm of breast: Secondary | ICD-10-CM
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
## 2019-05-23 LAB — GENETIC SCREENING ORDER

## 2019-05-25 ENCOUNTER — Ambulatory Visit: Payer: BC Managed Care – PPO | Admitting: Nurse Practitioner

## 2019-05-28 DIAGNOSIS — M7989 Other specified soft tissue disorders: Secondary | ICD-10-CM | POA: Diagnosis not present

## 2019-05-28 DIAGNOSIS — Z79899 Other long term (current) drug therapy: Secondary | ICD-10-CM | POA: Diagnosis not present

## 2019-05-28 DIAGNOSIS — M79643 Pain in unspecified hand: Secondary | ICD-10-CM | POA: Diagnosis not present

## 2019-05-28 DIAGNOSIS — M0579 Rheumatoid arthritis with rheumatoid factor of multiple sites without organ or systems involvement: Secondary | ICD-10-CM | POA: Diagnosis not present

## 2019-06-01 ENCOUNTER — Telehealth: Payer: Self-pay | Admitting: Genetic Counselor

## 2019-06-01 ENCOUNTER — Encounter: Payer: Self-pay | Admitting: Genetic Counselor

## 2019-06-01 ENCOUNTER — Ambulatory Visit: Payer: Self-pay | Admitting: Genetic Counselor

## 2019-06-01 DIAGNOSIS — Z1379 Encounter for other screening for genetic and chromosomal anomalies: Secondary | ICD-10-CM | POA: Insufficient documentation

## 2019-06-01 NOTE — Progress Notes (Addendum)
HPI:  Lindsey Carlson was previously seen in the [place] clinic due to a family history of breast cancer and concerns regarding a hereditary predisposition to cancer. Please refer to our prior cancer genetics clinic note for more information regarding our discussion, assessment and recommendations, at the time. Ms. Leber recent genetic test results were disclosed to her, as were recommendations warranted by these results. These results and recommendations are discussed in more detail below.  CANCER HISTORY:  Oncology History   No history exists.    FAMILY HISTORY:  We obtained a detailed, 4-generation family history.  Significant diagnoses are listed below: Family History  Problem Relation Age of Onset  . Other Mother 15       GSW  . Alzheimer's disease Father   . Hypertension Father   . Diabetes Father   . Breast cancer Paternal Aunt 35  . Cancer Paternal Grandfather        ?  Marland Kitchen Breast cancer Cousin 6       pat first cousin  . Diabetes Brother   . Eczema Brother   . Allergies Brother   . Diabetes Paternal Grandmother   . Breast cancer Paternal Aunt 47  . Breast cancer Paternal Aunt 23  . Breast cancer Paternal Aunt 50    The patient has a son and two daughters who are cancer free.  She has a full brother who had a mass under his chin removed but she does not know if it was cancer.  She has a paternal half sister who is cancer free.  Both parents are deceased.  The patient's mother was killed at age 86.  She never had cancer.  She has 8 siblings who are cancer free.  The maternal grandparents are deceased due to non-cancer related issues.  The patient's father died from complications of diabetes and dementia.  He had five sisters and five brothers.  Four sisters had breast cancer in their 74's, and one daughter of his brother had breast cancer at 15.  This daughter had a daughter who underwent genetic testing and reportedly tested positive for something.  The  paternal grandparents are deceased. The grandfather had some form of cancer.  Ms. Rispoli is aware of previous family history of genetic testing for hereditary cancer risks. Patient's maternal ancestors are of [place] descent, and paternal ancestors are of [place] descent. There is no reported Ashkenazi Jewish ancestry. There is no known consanguinity.    GENETIC TEST RESULTS: Genetic testing reported out on May 31, 2019 through the CancerNext-Expanded+RNAinsight cancer panel found no pathogenic mutations. The CancerNext-Expanded gene panel offered by [facility] and includes sequencing and rearrangement analysis for the following 77 genes: AIP, ALK, APC*, ATM*, AXIN2, BAP1, BARD1, BLM, BMPR1A, BRCA1*, BRCA2*, BRIP1*, CDC73, CDH1*, CDK4, CDKN1B, CDKN2A, CHEK2*, CTNNA1, DICER1, FANCC, FH, FLCN, GALNT12, KIF1B, LZTR1, MAX, MEN1, MET, MLH1*, MSH2*, MSH3, MSH6*, MUTYH*, NBN, NF1*, NF2, NTHL1, PALB2*, PHOX2B, PMS2*, POT1, PRKAR1A, PTCH1, PTEN*, RAD51C*, RAD51D*, RB1, RECQL, RET, SDHA, SDHAF2, SDHB, SDHC, SDHD, SMAD4, SMARCA4, SMARCB1, SMARCE1, STK11, SUFU, TMEM127, TP53*, TSC1, TSC2, VHL and XRCC2 (sequencing and deletion/duplication); EGFR, EGLN1, HOXB13, KIT, MITF, PDGFRA, POLD1, and POLE (sequencing only); EPCAM and GREM1 (deletion/duplication only). DNA and RNA analyses performed for * genes. The test report has been scanned into EPIC and is located under the Molecular Pathology section of the Results Review tab.  A portion of the result report is included below for reference.     We discussed with Ms.  Kaczynski that because current genetic testing is not perfect, it is possible there may be a gene mutation in one of these genes that current testing cannot detect, but that chance is small.  We also discussed, that there could be another gene that has not yet been discovered, or that we have not yet tested, that is responsible for the cancer diagnoses in the family. It is also possible there is  a hereditary cause for the cancer in the family that Ms. Benscoter did not inherit and therefore was not identified in her testing.  Therefore, it is important to remain in touch with cancer genetics in the future so that we can continue to offer Ms. Seifert the most up to date genetic testing.   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  At this time, it is unknown if this variant is associated with increased cancer risk or if this is a normal finding, but most variants such as this get reclassified to being inconsequential. It should not be used to make medical management decisions. With time, we suspect the lab will determine the significance of this variant, if any. If we do learn more about it, we will try to contact Ms. Tailor to discuss it further. However, it is important to stay in touch with [place] periodically and keep the address and phone number up to date.  Based on the patient's family history, a statistical model (Tyrer Cusik) was used to estimate her risk of developing breast cancer. This estimates her lifetime risk of developing breast cancer to be approximately 15.6%. This estimation does not consider any genetic testing results.  The patient's lifetime breast cancer risk is a preliminary estimate based on available information using one of several models endorsed by the [place] (ACS). The ACS recommends consideration of breast MRI screening as an adjunct to mammography for patients at high risk (defined as 20% or greater lifetime risk).    ADDITIONAL GENETIC TESTING: We discussed with Ms. Persad that there are other genes that are associated with increased cancer risk that can be analyzed. Should Ms. Straley wish to pursue additional genetic testing, we are happy to discuss and coordinate this testing, at any time.    CANCER SCREENING RECOMMENDATIONS: Ms. Champa test result is considered negative (normal).   This means that we have not identified a hereditary cause for her family history of breast cancer at this time. Most cancers happen by chance and this negative test suggests that her cancer may fall into this category.    While reassuring, this does not definitively rule out a hereditary predisposition to cancer. It is still possible that there could be genetic mutations that are undetectable by current technology. There could be genetic mutations in genes that have not been tested or identified to increase cancer risk.  Therefore, it is recommended she continue to follow the cancer management and screening guidelines provided by her primary healthcare provider.   An individual's cancer risk and medical management are not determined by genetic test results alone. Overall cancer risk assessment incorporates additional factors, including personal medical history, family history, and any available genetic information that may result in a personalized plan for cancer prevention and surveillance  RECOMMENDATIONS FOR FAMILY MEMBERS:  Individuals in this family might be at some increased risk of developing cancer, over the general population risk, simply due to the family history of cancer.  We recommended women in this family  have a yearly mammogram beginning at age 23, or 67 years younger than the earliest onset of cancer, an annual clinical breast exam, and perform monthly breast self-exams. Women in this family should also have a gynecological exam as recommended by their primary provider. All family members should have a colonoscopy by age 9.  It is also possible there is a hereditary cause for the cancer in Ms. Previti's family that she did not inherit and therefore was not identified in her.  Based on Ms. Caudill's family history, we recommended her paternal aunts, who were diagnosed with breast cancer, have genetic counseling and testing. Ms. Deshazer will let us know if we can be of any assistance in  coordinating genetic counseling and/or testing for this family member.   FOLLOW-UP: Lastly, we discussed with Ms. Stanard that cancer genetics is a rapidly advancing field and it is possible that new genetic tests will be appropriate for her and/or her family members in the future. We encouraged her to remain in contact with cancer genetics on an annual basis so we can update her personal and family histories and let her know of advances in cancer genetics that may benefit this family.   Our contact number was provided. Ms. Miguez questions were answered to her satisfaction, and she knows she is welcome to call us at anytime with additional questions or concerns.   Roma Kayser, [place], [facility] Licensed, Certified Genetic Counselor [email or web address]

## 2019-06-01 NOTE — Telephone Encounter (Signed)
Revealed negative genetic testing.  Discussed that we do not know why there is cancer in the family. It could be due to a different gene that we are not testing, or maybe our current technology may not be able to pick something up.  It will be important for her to keep in contact with genetics to keep up with whether additional testing may be needed.  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  This will not change medical management.

## 2019-06-18 ENCOUNTER — Encounter: Payer: Self-pay | Admitting: Nurse Practitioner

## 2019-06-23 ENCOUNTER — Other Ambulatory Visit: Payer: Self-pay | Admitting: Nurse Practitioner

## 2019-06-23 DIAGNOSIS — M79609 Pain in unspecified limb: Secondary | ICD-10-CM

## 2019-06-28 DIAGNOSIS — M0579 Rheumatoid arthritis with rheumatoid factor of multiple sites without organ or systems involvement: Secondary | ICD-10-CM | POA: Diagnosis not present

## 2019-06-28 DIAGNOSIS — Z79899 Other long term (current) drug therapy: Secondary | ICD-10-CM | POA: Diagnosis not present

## 2019-06-28 DIAGNOSIS — R768 Other specified abnormal immunological findings in serum: Secondary | ICD-10-CM | POA: Diagnosis not present

## 2019-06-28 DIAGNOSIS — M79643 Pain in unspecified hand: Secondary | ICD-10-CM | POA: Diagnosis not present

## 2019-08-03 DIAGNOSIS — M79643 Pain in unspecified hand: Secondary | ICD-10-CM | POA: Diagnosis not present

## 2019-08-03 DIAGNOSIS — Z79899 Other long term (current) drug therapy: Secondary | ICD-10-CM | POA: Diagnosis not present

## 2019-08-03 DIAGNOSIS — M0579 Rheumatoid arthritis with rheumatoid factor of multiple sites without organ or systems involvement: Secondary | ICD-10-CM | POA: Diagnosis not present

## 2019-08-03 DIAGNOSIS — R768 Other specified abnormal immunological findings in serum: Secondary | ICD-10-CM | POA: Diagnosis not present

## 2019-10-14 ENCOUNTER — Emergency Department (HOSPITAL_COMMUNITY)
Admission: EM | Admit: 2019-10-14 | Discharge: 2019-10-14 | Disposition: A | Discharge status: Home / Self Care | Payer: Medicaid Other | Attending: Emergency Medicine | Admitting: Emergency Medicine

## 2019-10-14 ENCOUNTER — Emergency Department (HOSPITAL_COMMUNITY): Payer: Medicaid Other

## 2019-10-14 ENCOUNTER — Encounter (HOSPITAL_COMMUNITY): Payer: Self-pay | Admitting: Emergency Medicine

## 2019-10-14 ENCOUNTER — Other Ambulatory Visit: Payer: Self-pay

## 2019-10-14 DIAGNOSIS — R079 Chest pain, unspecified: Secondary | ICD-10-CM | POA: Insufficient documentation

## 2019-10-14 LAB — CBC
HCT: 39.7 % (ref 36.0–46.0)
Hemoglobin: 12.7 g/dL (ref 12.0–15.0)
MCH: 28.5 pg (ref 26.0–34.0)
MCHC: 32 g/dL (ref 30.0–36.0)
MCV: 89 fL (ref 80.0–100.0)
Platelets: 314 10*3/uL (ref 150–400)
RBC: 4.46 MIL/uL (ref 3.87–5.11)
RDW: 13.4 % (ref 11.5–15.5)
WBC: 8.6 10*3/uL (ref 4.0–10.5)
nRBC: 0 % (ref 0.0–0.2)

## 2019-10-14 LAB — BASIC METABOLIC PANEL
Anion gap: 10 (ref 5–15)
BUN: 8 mg/dL (ref 6–20)
CO2: 22 mmol/L (ref 22–32)
Calcium: 9.3 mg/dL (ref 8.9–10.3)
Chloride: 105 mmol/L (ref 98–111)
Creatinine, Ser: 0.67 mg/dL (ref 0.44–1.00)
GFR calc Af Amer: >60 mL/min (ref >60)
GFR calc non Af Amer: >60 mL/min (ref >60)
Glucose, Bld: 100 mg/dL — ABNORMAL HIGH (ref 70–99)
Potassium: 3.5 mmol/L (ref 3.5–5.1)
Sodium: 137 mmol/L (ref 135–145)

## 2019-10-14 LAB — I-STAT BETA HCG BLOOD, ED (MC, WL, AP ONLY): I-stat hCG, quantitative: <5 m[IU]/mL (ref <5)

## 2019-10-14 LAB — TROPONIN I (HIGH SENSITIVITY): Troponin I (High Sensitivity): <2 ng/L (ref <18)

## 2019-10-14 MED ORDER — ACETAMINOPHEN 325 MG PO TABS
650.0000 mg | ORAL_TABLET | Freq: Once | ORAL | Status: AC
  Administered 2019-10-14: 650 mg via ORAL
  Filled 2019-10-14: qty 2

## 2019-10-14 MED ORDER — SODIUM CHLORIDE 0.9% FLUSH: 3.0000 mL | Freq: Once | INTRAVENOUS | Status: DC

## 2019-10-14 NOTE — ED Notes (Signed)
Requested assistance from phlebotomy for blood draw.

## 2019-10-14 NOTE — ED Triage Notes (Signed)
Pt to ED with c/o sudden onset of mid chest pain while working.  Pt describes pain as tight.  Pt hyperventilating on arrival to ED

## 2019-10-14 NOTE — ED Notes (Signed)
Second attempt to draw troponin unsuccessful.
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
## 2019-10-14 NOTE — ED Notes (Signed)
Patient verbalizes understanding of discharge instructions. Opportunity for questioning and answers were provided. Armband removed by staff, pt discharged from ED.  

## 2019-10-14 NOTE — ED Notes (Signed)
Unsuccessful blood draw attempt. 

## 2019-10-14 NOTE — Discharge Instructions (Addendum)
You were seen in the emergency department for pain in your chest and shortness of breath in the setting of increased stress and workload at work.  You had blood work EKG and a chest x-ray that did not show any serious findings.  Please follow-up with your regular doctor and return to the emergency department if any worsening or concerning symptoms

## 2019-10-15 ENCOUNTER — Other Ambulatory Visit: Payer: Self-pay

## 2019-10-15 ENCOUNTER — Encounter: Payer: Self-pay | Admitting: Nurse Practitioner

## 2019-10-16 ENCOUNTER — Other Ambulatory Visit: Payer: Self-pay

## 2019-10-16 ENCOUNTER — Encounter: Payer: Self-pay | Admitting: Family Medicine

## 2019-10-16 ENCOUNTER — Ambulatory Visit: Payer: PRIVATE HEALTH INSURANCE | Admitting: Family Medicine

## 2019-10-16 ENCOUNTER — Ambulatory Visit: Payer: Self-pay | Admitting: Family Medicine

## 2019-10-16 VITALS — BP 118/70 | HR 76 | Temp 97.7°F | Ht 61.0 in | Wt 151.4 lb

## 2019-10-16 DIAGNOSIS — F430 Acute stress reaction: Secondary | ICD-10-CM
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
## 2019-10-17 ENCOUNTER — Ambulatory Visit: Payer: PRIVATE HEALTH INSURANCE | Admitting: Nurse Practitioner

## 2020-02-29 ENCOUNTER — Ambulatory Visit: Payer: Self-pay | Admitting: Nurse Practitioner

## 2020-03-27 ENCOUNTER — Other Ambulatory Visit: Payer: Self-pay

## 2020-03-28 ENCOUNTER — Encounter: Payer: Self-pay | Admitting: Nurse Practitioner

## 2020-03-28 ENCOUNTER — Ambulatory Visit (INDEPENDENT_AMBULATORY_CARE_PROVIDER_SITE_OTHER): Payer: Medicaid Other | Admitting: Nurse Practitioner

## 2020-03-28 VITALS — BP 122/88 | HR 80 | Temp 97.5°F | Ht 61.0 in | Wt 155.0 lb

## 2020-03-28 DIAGNOSIS — Z113 Encounter for screening for infections with a predominantly sexual mode of transmission: Secondary | ICD-10-CM

## 2020-03-28 DIAGNOSIS — B351 Tinea unguium: Secondary | ICD-10-CM

## 2020-03-28 MED ORDER — TERBINAFINE HCL 250 MG PO TABS: 250.0000 mg | ORAL_TABLET | Freq: Every day | ORAL | 0 refills | Status: DC

## 2020-03-28 NOTE — Patient Instructions (Addendum)
Return to lab in 4month for blood draw and urine collection  Start lamisil. Soak feet in vinegar and water once a day.  There is no interaction between lamisil and methotrexate.   Fungal Nail Infection A fungal nail infection is a common infection of the toenails or fingernails. This condition affects toenails more often than fingernails. It often affects the great, or big, toes. More than one nail may be infected. The condition can be passed from person to person (is contagious). What are the causes? This condition is caused by a fungus. Several types of fungi can cause the infection. These fungi are common in moist and warm areas. If your hands or feet come into contact with the fungus, it may get into a crack in your fingernail or toenail and cause the infection. What increases the risk? The following factors may make you more likely to develop this condition:  Being female.  Being of older age.  Living with someone who has the fungus.  Walking barefoot in areas where the fungus thrives, such as showers or locker rooms.  Wearing shoes and socks that cause your feet to sweat.  Having a nail injury or a recent nail surgery.  Having certain medical conditions, such as: ? Athlete's foot. ? Diabetes. ? Psoriasis. ? Poor circulation. ? A weak body defense system (immune system). What are the signs or symptoms? Symptoms of this condition include:  A pale spot on the nail.  Thickening of the nail.  A nail that becomes yellow or brown.  A brittle or ragged nail edge.  A crumbling nail.  A nail that has lifted away from the nail bed. How is this diagnosed? This condition is diagnosed with a physical exam. Your health care provider may take a scraping or clipping from your nail to test for the fungus. How is this treated? Treatment is not needed for mild infections. If you have significant nail changes, treatment may include:  Antifungal medicines taken by mouth (orally).  You may need to take the medicine for several weeks or several months, and you may not see the results for a long time. These medicines can cause side effects. Ask your health care provider what problems to watch for.  Antifungal nail polish or nail cream. These may be used along with oral antifungal medicines.  Laser treatment of the nail.  Surgery to remove the nail. This may be needed for the most severe infections. It can take a long time, usually up to a year, for the infection to go away. The infection may also come back. Follow these instructions at home: Medicines  Take or apply over-the-counter and prescription medicines only as told by your health care provider.  Ask your health care provider about using over-the-counter mentholated ointment on your nails. Nail care  Trim your nails often.  Wash and dry your hands and feet every day.  Keep your feet dry: ? Wear absorbent socks, and change your socks frequently. ? Wear shoes that allow air to circulate, such as sandals or canvas tennis shoes. Throw out old shoes.  Do not use artificial nails.  If you go to a nail salon, make sure you choose one that uses clean instruments.  Use antifungal foot powder on your feet and in your shoes. General instructions  Do not share personal items, such as towels or nail clippers.  Do not walk barefoot in shower rooms or locker rooms.  Wear rubber gloves if you are working with your hands in  wet areas.  Keep all follow-up visits as told by your health care provider. This is important. Contact a health care provider if: Your infection is not getting better or it is getting worse after several months. Summary  A fungal nail infection is a common infection of the toenails or fingernails.  Treatment is not needed for mild infections. If you have significant nail changes, treatment may include taking medicine orally and applying medicine to your nails.  It can take a long time,  usually up to a year, for the infection to go away. The infection may also come back.  Take or apply over-the-counter and prescription medicines only as told by your health care provider.  Follow instructions for taking care of your nails to help prevent infection from coming back or spreading. This information is not intended to replace advice given to you by your health care provider. Make sure you discuss any questions you have with your health care provider. Document Revised: 09/28/2018 Document Reviewed: 11/11/2017 Elsevier Patient Education  2020 [employer].

## 2020-03-28 NOTE — Progress Notes (Signed)
Subjective:  Patient ID: Lindsey Carlson, female    DOB: 1981/03/03  Age: 39 y.o. MRN: [id number]  CC: Acute Visit (Pt c/o toe infection on her right toe nails but the right big toe is the worse, pt states it is very painful)  Toe Pain  Incident onset: 52months. There was no injury mechanism. The pain is present in the right toes. The quality of the pain is described as aching. The pain is moderate. The pain has been constant since onset. Pertinent negatives include no inability to bear weight, numbness or tingling. She reports no foreign bodies present. Treatments tried: OTC antifungal. The treatment provided no relief.   She will also like STD screen. Denies any symptoms. Has not been sexually active since 05/2019.  Reviewed past Medical, Social and Family history today.  Outpatient Medications Prior to Visit  Medication Sig Dispense Refill  . folic acid (FOLVITE) 1 MG tablet Take 1 mg by mouth daily.    . methotrexate (RHEUMATREX) 2.5 MG tablet Take 15 mg by mouth once a week.    . Multiple Vitamin (MULTI VITAMIN DAILY PO) Take by mouth.    . predniSONE (DELTASONE) 10 MG tablet 2 TABLETS DAILY 2 WEEKS THEN 1 TAB DAILY 2 WEEKS ORALLY 30 DAY(S)    . meloxicam (MOBIC) 7.5 MG tablet TAKE 1 TABLET (7.5 MG TOTAL) BY MOUTH DAILY. WITH FOOD (Patient not taking: Reported on 10/16/2019) 30 tablet 1   No facility-administered medications prior to visit.    ROS See HPI  Objective:  BP 122/88 (BP Location: Left Arm, Patient Position: Sitting, Cuff Size: Normal)   Pulse 80   Temp (!) 97.5 F (36.4 C) (Temporal)   Ht 5\' 1"  (1.549 m)   Wt 155 lb (70.3 kg)   SpO2 100%   BMI 29.29 kg/m   Physical Exam Vitals and nursing note reviewed.  Cardiovascular:     Pulses:          Dorsalis pedis pulses are 2+ on the right side and 2+ on the left side.       Posterior tibial pulses are 2+ on the right side and 2+ on the left side.  Feet:     Right foot:     Toenail Condition: Right toenails are  normal.     Left foot:     Skin integrity: Skin integrity normal.     Toenail Condition: Left toenails are abnormally thick. Fungal disease present. Neurological:     Mental Status: She is alert and oriented to person, place, and time.    Assessment & Plan:  This visit occurred during the SARS-CoV-2 public health emergency.  Safety protocols were in place, including screening questions prior to the visit, additional usage of staff PPE, and extensive cleaning of exam room while observing appropriate contact time as indicated for disinfecting solutions.   Lindsey Carlson was seen today for acute visit.  Diagnoses and all orders for this visit:  Onychomycosis -     terbinafine (LAMISIL) 250 MG tablet; Take 1 tablet (250 mg total) by mouth daily. -     Hepatic function panel; Future  Screen for STD (sexually transmitted disease) -     Cancel: HIV antibody (with reflex); Future -     Cancel: Urine cytology ancillary only([place]); Future -     Cancel: RPR; Future -     Cancel: Hepatic function panel; Future -     HIV antibody (with reflex); Future -     RPR; Future -  Urine cytology ancillary only([place]); Future  Return to lab in 76month for blood draw and urine collection Start lamisil. Soak feet in vinegar and water once a day. There is no interaction between lamisil and methotrexate.  Problem List Items Addressed This Visit    None    Visit Diagnoses    Onychomycosis    -  Primary   Relevant Medications   terbinafine (LAMISIL) 250 MG tablet   Other Relevant Orders   Hepatic function panel   Screen for STD (sexually transmitted disease)       Relevant Orders   HIV antibody (with reflex)   RPR   Urine cytology ancillary only([place])      Follow-up: No follow-ups on file.  Alysia Penna, NP

## 2020-03-31 ENCOUNTER — Ambulatory Visit: Payer: Medicaid Other | Admitting: Nurse Practitioner

## 2020-04-01 ENCOUNTER — Encounter: Payer: Self-pay | Admitting: Nurse Practitioner

## 2020-04-08 ENCOUNTER — Ambulatory Visit
Admission: RE | Admit: 2020-04-08 | Discharge: 2020-04-08 | Disposition: A | Discharge status: Home / Self Care | Payer: Medicaid Other | Source: Ambulatory Visit | Attending: Nurse Practitioner | Admitting: Nurse Practitioner

## 2020-04-08 ENCOUNTER — Other Ambulatory Visit: Payer: Self-pay

## 2020-04-08 VITALS — BP 118/79 | HR 82 | Temp 98.2°F | Resp 18

## 2020-04-08 DIAGNOSIS — H0011 Chalazion right upper eyelid: Secondary | ICD-10-CM

## 2020-04-08 DIAGNOSIS — M79609 Pain in unspecified limb: Secondary | ICD-10-CM

## 2020-04-08 DIAGNOSIS — R202 Paresthesia of skin: Secondary | ICD-10-CM

## 2020-04-08 HISTORY — DX: Rheumatoid arthritis, unspecified: M06.9

## 2020-04-08 NOTE — Discharge Instructions (Signed)
Hot compresses for 10 minutes, every 1-2 hours. Important to follow up with Ophthalmology (eye doctor). Return sooner for worsening of symptoms, change in vision, sensitivity to light, eye swelling, painful eye movement, or fever.
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
## 2020-04-08 NOTE — ED Triage Notes (Signed)
Pt here for right upper eye lid pain and swelling upon waking this am

## 2020-04-16 ENCOUNTER — Ambulatory Visit (HOSPITAL_COMMUNITY): Admission: RE | Admit: 2020-04-16 | Payer: Medicaid Other | Source: Ambulatory Visit

## 2020-04-16 DIAGNOSIS — Z1231 Encounter for screening mammogram for malignant neoplasm of breast: Secondary | ICD-10-CM

## 2020-04-17 ENCOUNTER — Other Ambulatory Visit (HOSPITAL_COMMUNITY): Payer: Self-pay | Admitting: Emergency Medicine

## 2020-04-17 DIAGNOSIS — Z1231 Encounter for screening mammogram for malignant neoplasm of breast: Secondary | ICD-10-CM

## 2020-04-22 ENCOUNTER — Other Ambulatory Visit: Payer: Self-pay | Admitting: Nurse Practitioner

## 2020-04-22 ENCOUNTER — Other Ambulatory Visit (HOSPITAL_COMMUNITY): Payer: Self-pay | Admitting: Nurse Practitioner

## 2020-04-22 DIAGNOSIS — R928 Other abnormal and inconclusive findings on diagnostic imaging of breast: Secondary | ICD-10-CM

## 2020-04-23 ENCOUNTER — Inpatient Hospital Stay (HOSPITAL_COMMUNITY): Admission: RE | Admit: 2020-04-23 | Payer: Medicaid Other | Source: Ambulatory Visit

## 2020-04-23 DIAGNOSIS — Z1231 Encounter for screening mammogram for malignant neoplasm of breast: Secondary | ICD-10-CM

## 2020-04-28 ENCOUNTER — Other Ambulatory Visit: Payer: Medicaid Other

## 2020-04-29 ENCOUNTER — Other Ambulatory Visit: Payer: Medicaid Other

## 2020-05-05 ENCOUNTER — Other Ambulatory Visit: Payer: Self-pay

## 2020-05-05 ENCOUNTER — Other Ambulatory Visit (HOSPITAL_COMMUNITY)
Admission: RE | Admit: 2020-05-05 | Discharge: 2020-05-05 | Disposition: A | Discharge status: Home / Self Care | Payer: Medicaid Other | Source: Ambulatory Visit | Attending: Nurse Practitioner | Admitting: Nurse Practitioner

## 2020-05-05 ENCOUNTER — Encounter: Payer: Self-pay | Admitting: Nurse Practitioner

## 2020-05-05 ENCOUNTER — Other Ambulatory Visit (INDEPENDENT_AMBULATORY_CARE_PROVIDER_SITE_OTHER): Payer: Medicaid Other

## 2020-05-05 ENCOUNTER — Encounter: Payer: Medicaid Other | Admitting: Nurse Practitioner

## 2020-05-05 DIAGNOSIS — A5901 Trichomonal vulvovaginitis: Secondary | ICD-10-CM | POA: Diagnosis not present

## 2020-05-05 DIAGNOSIS — Z113 Encounter for screening for infections with a predominantly sexual mode of transmission: Secondary | ICD-10-CM | POA: Diagnosis present

## 2020-05-05 DIAGNOSIS — B351 Tinea unguium: Secondary | ICD-10-CM

## 2020-05-05 LAB — HEPATIC FUNCTION PANEL
ALT: 14 U/L (ref 0–35)
AST: 17 U/L (ref 0–37)
Albumin: 4.1 g/dL (ref 3.5–5.2)
Alkaline Phosphatase: 62 U/L (ref 39–117)
Bilirubin, Direct: 0.1 mg/dL (ref 0.0–0.3)
Total Bilirubin: 0.5 mg/dL (ref 0.2–1.2)
Total Protein: 7.4 g/dL (ref 6.0–8.3)

## 2020-05-05 MED ORDER — TERBINAFINE HCL 250 MG PO TABS: 250.0000 mg | ORAL_TABLET | Freq: Every day | ORAL | 1 refills | Status: DC

## 2020-05-06 LAB — URINE CYTOLOGY ANCILLARY ONLY
Chlamydia: NEGATIVE
Comment: NEGATIVE
Comment: NEGATIVE
Comment: NORMAL
Neisseria Gonorrhea: NEGATIVE
Trichomonas: POSITIVE — AB

## 2020-05-06 LAB — HIV ANTIBODY (ROUTINE TESTING W REFLEX): HIV 1&2 Ab, 4th Generation: NONREACTIVE

## 2020-05-06 LAB — RPR: RPR Ser Ql: NONREACTIVE

## 2020-05-06 MED ORDER — METRONIDAZOLE 500 MG PO TABS: 2000.0000 mg | ORAL_TABLET | Freq: Once | ORAL | 0 refills | Status: AC

## 2020-05-06 NOTE — Progress Notes (Signed)
No charge.  This encounter was created in error - please disregard.

## 2020-05-07 ENCOUNTER — Encounter: Payer: Self-pay | Admitting: Nurse Practitioner

## 2020-05-07 ENCOUNTER — Telehealth: Payer: Self-pay | Admitting: Nurse Practitioner

## 2020-05-07 DIAGNOSIS — A599 Trichomoniasis, unspecified: Secondary | ICD-10-CM

## 2020-05-07 NOTE — Progress Notes (Signed)
Rx was sent yesterday. Call pharmacy to confirm rx was received.

## 2020-05-07 NOTE — Telephone Encounter (Signed)
-----   Message from Priscella Mann, [place] sent at 05/07/2020  8:47 AM EST ----- Rx needs to be sent in to pharmacy

## 2020-05-08 ENCOUNTER — Telehealth: Payer: Self-pay

## 2020-05-08 NOTE — Telephone Encounter (Signed)
Pt c/o urinating dark color.  Pt explains that there isn't any pain or other symptoms but she is very concerned about the color and would like a call back.  Please advise.  CB# [phone]

## 2020-05-09 NOTE — Telephone Encounter (Signed)
Spoke with patient and she states her urine has returned to normal color and she feels fine.

## 2020-05-12 ENCOUNTER — Other Ambulatory Visit (HOSPITAL_COMMUNITY)
Admission: RE | Admit: 2020-05-12 | Discharge: 2020-05-12 | Disposition: A | Discharge status: Home / Self Care | Payer: Medicaid Other | Source: Ambulatory Visit | Attending: Nurse Practitioner | Admitting: Nurse Practitioner

## 2020-05-12 ENCOUNTER — Other Ambulatory Visit (INDEPENDENT_AMBULATORY_CARE_PROVIDER_SITE_OTHER): Payer: Medicaid Other

## 2020-05-12 ENCOUNTER — Other Ambulatory Visit: Payer: Self-pay

## 2020-05-12 DIAGNOSIS — A5901 Trichomonal vulvovaginitis: Secondary | ICD-10-CM | POA: Diagnosis not present

## 2020-05-13 LAB — URINE CYTOLOGY ANCILLARY ONLY
Chlamydia: NEGATIVE
Comment: NEGATIVE
Comment: NEGATIVE
Comment: NORMAL
Neisseria Gonorrhea: NEGATIVE
Trichomonas: NEGATIVE

## 2020-05-30 ENCOUNTER — Other Ambulatory Visit: Payer: Self-pay | Admitting: Nurse Practitioner

## 2020-05-30 DIAGNOSIS — R928 Other abnormal and inconclusive findings on diagnostic imaging of breast: Secondary | ICD-10-CM

## 2020-05-30 DIAGNOSIS — N644 Mastodynia: Secondary | ICD-10-CM

## 2020-05-30 DIAGNOSIS — N6320 Unspecified lump in the left breast, unspecified quadrant: Secondary | ICD-10-CM

## 2020-06-10 ENCOUNTER — Ambulatory Visit
Admission: RE | Admit: 2020-06-10 | Discharge: 2020-06-10 | Disposition: A | Discharge status: Home / Self Care | Payer: Medicaid Other | Source: Ambulatory Visit | Attending: Nurse Practitioner | Admitting: Nurse Practitioner

## 2020-06-10 ENCOUNTER — Other Ambulatory Visit: Payer: Self-pay

## 2020-06-10 DIAGNOSIS — N644 Mastodynia: Secondary | ICD-10-CM

## 2020-06-10 DIAGNOSIS — R928 Other abnormal and inconclusive findings on diagnostic imaging of breast: Secondary | ICD-10-CM

## 2020-06-10 DIAGNOSIS — N6320 Unspecified lump in the left breast, unspecified quadrant: Secondary | ICD-10-CM

## 2020-06-19 ENCOUNTER — Ambulatory Visit
Admission: EM | Admit: 2020-06-19 | Discharge: 2020-06-19 | Disposition: A | Discharge status: Home / Self Care | Payer: Medicaid Other | Attending: Emergency Medicine | Admitting: Emergency Medicine

## 2020-06-19 ENCOUNTER — Other Ambulatory Visit: Payer: Self-pay

## 2020-06-19 DIAGNOSIS — R52 Pain, unspecified: Secondary | ICD-10-CM

## 2020-06-19 DIAGNOSIS — B349 Viral infection, unspecified: Secondary | ICD-10-CM

## 2020-06-19 MED ORDER — IBUPROFEN 800 MG PO TABS: 800.0000 mg | ORAL_TABLET | Freq: Three times a day (TID) | ORAL | 0 refills | Status: DC

## 2020-06-19 NOTE — ED Triage Notes (Signed)
Pt presents with fever body aches x 2 days. Has been exposed to several sick family members with pending covid tests.
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
## 2020-06-19 NOTE — Discharge Instructions (Signed)
COVID test pending Ibuprofen and tylenol for fever, body aches headache May continue over the counter medicine for cough and congestion Follow up if not improving

## 2020-06-22 ENCOUNTER — Encounter: Payer: Self-pay | Admitting: Nurse Practitioner

## 2020-06-22 LAB — COVID-19, FLU A+B NAA
Influenza A, NAA: NOT DETECTED
Influenza B, NAA: NOT DETECTED
SARS-CoV-2, NAA: DETECTED — AB

## 2020-06-22 LAB — SPECIMEN STATUS REPORT

## 2020-06-23 ENCOUNTER — Encounter (HOSPITAL_BASED_OUTPATIENT_CLINIC_OR_DEPARTMENT_OTHER): Payer: Self-pay

## 2020-06-23 ENCOUNTER — Emergency Department (HOSPITAL_BASED_OUTPATIENT_CLINIC_OR_DEPARTMENT_OTHER)
Admission: EM | Admit: 2020-06-23 | Discharge: 2020-06-24 | Disposition: A | Discharge status: Home / Self Care | Payer: Medicaid Other | Attending: Emergency Medicine | Admitting: Emergency Medicine

## 2020-06-23 ENCOUNTER — Other Ambulatory Visit: Payer: Self-pay

## 2020-06-23 ENCOUNTER — Telehealth (INDEPENDENT_AMBULATORY_CARE_PROVIDER_SITE_OTHER): Payer: Medicaid Other | Admitting: Nurse Practitioner

## 2020-06-23 ENCOUNTER — Emergency Department (HOSPITAL_BASED_OUTPATIENT_CLINIC_OR_DEPARTMENT_OTHER): Payer: Medicaid Other

## 2020-06-23 ENCOUNTER — Telehealth: Payer: Medicaid Other | Admitting: Nurse Practitioner

## 2020-06-23 ENCOUNTER — Encounter: Payer: Self-pay | Admitting: Nurse Practitioner

## 2020-06-23 VITALS — BP 134/99 | HR 78 | Temp 98.1°F | Ht 61.0 in | Wt 154.0 lb

## 2020-06-23 DIAGNOSIS — R1011 Right upper quadrant pain: Secondary | ICD-10-CM | POA: Diagnosis not present

## 2020-06-23 DIAGNOSIS — U071 COVID-19: Secondary | ICD-10-CM | POA: Diagnosis not present

## 2020-06-23 DIAGNOSIS — R109 Unspecified abdominal pain: Secondary | ICD-10-CM | POA: Diagnosis present

## 2020-06-23 DIAGNOSIS — M2550 Pain in unspecified joint: Secondary | ICD-10-CM | POA: Insufficient documentation

## 2020-06-23 DIAGNOSIS — R768 Other specified abnormal immunological findings in serum: Secondary | ICD-10-CM | POA: Insufficient documentation

## 2020-06-23 DIAGNOSIS — M1990 Unspecified osteoarthritis, unspecified site: Secondary | ICD-10-CM | POA: Insufficient documentation

## 2020-06-23 DIAGNOSIS — M069 Rheumatoid arthritis, unspecified: Secondary | ICD-10-CM | POA: Insufficient documentation

## 2020-06-23 LAB — COMPREHENSIVE METABOLIC PANEL
ALT: 21 U/L (ref 0–44)
AST: 23 U/L (ref 15–41)
Albumin: 4.1 g/dL (ref 3.5–5.0)
Alkaline Phosphatase: 61 U/L (ref 38–126)
Anion gap: 10 (ref 5–15)
BUN: 9 mg/dL (ref 6–20)
CO2: 24 mmol/L (ref 22–32)
Calcium: 9.1 mg/dL (ref 8.9–10.3)
Chloride: 104 mmol/L (ref 98–111)
Creatinine, Ser: 0.61 mg/dL (ref 0.44–1.00)
GFR, Estimated: >60 mL/min (ref >60)
Glucose, Bld: 85 mg/dL (ref 70–99)
Potassium: 3.6 mmol/L (ref 3.5–5.1)
Sodium: 138 mmol/L (ref 135–145)
Total Bilirubin: 0.3 mg/dL (ref 0.3–1.2)
Total Protein: 8.1 g/dL (ref 6.5–8.1)

## 2020-06-23 LAB — CBC WITH DIFFERENTIAL/PLATELET
Abs Immature Granulocytes: 0.01 10*3/uL (ref 0.00–0.07)
Basophils Absolute: 0 10*3/uL (ref 0.0–0.1)
Basophils Relative: 0 %
Eosinophils Absolute: 0.1 10*3/uL (ref 0.0–0.5)
Eosinophils Relative: 1 %
HCT: 41.9 % (ref 36.0–46.0)
Hemoglobin: 13.8 g/dL (ref 12.0–15.0)
Immature Granulocytes: 0 %
Lymphocytes Relative: 45 %
Lymphs Abs: 2.4 10*3/uL (ref 0.7–4.0)
MCH: 28.3 pg (ref 26.0–34.0)
MCHC: 32.9 g/dL (ref 30.0–36.0)
MCV: 86 fL (ref 80.0–100.0)
Monocytes Absolute: 0.4 10*3/uL (ref 0.1–1.0)
Monocytes Relative: 7 %
Neutro Abs: 2.5 10*3/uL (ref 1.7–7.7)
Neutrophils Relative %: 47 %
Platelets: 264 10*3/uL (ref 150–400)
RBC: 4.87 MIL/uL (ref 3.87–5.11)
RDW: 12.6 % (ref 11.5–15.5)
WBC: 5.3 10*3/uL (ref 4.0–10.5)
nRBC: 0 % (ref 0.0–0.2)

## 2020-06-23 LAB — LIPASE, BLOOD: Lipase: 46 U/L (ref 11–51)

## 2020-06-23 MED ORDER — NIRMATRELVIR/RITONAVIR (PAXLOVID)TABLET
3.0000 | ORAL_TABLET | Freq: Two times a day (BID) | ORAL | 0 refills | Status: DC
Start: 2020-06-23 — End: 2020-06-27

## 2020-06-23 MED ORDER — MORPHINE SULFATE (PF) 4 MG/ML IV SOLN
4.0000 mg | Freq: Once | INTRAVENOUS | Status: DC
  Filled 2020-06-23: qty 1

## 2020-06-23 MED ORDER — SODIUM CHLORIDE 0.9 % IV BOLUS: 1000.0000 mL | Freq: Once | INTRAVENOUS | Status: DC

## 2020-06-23 MED ORDER — ONDANSETRON HCL 4 MG/2ML IJ SOLN
4.0000 mg | Freq: Once | INTRAMUSCULAR | Status: DC
  Filled 2020-06-23: qty 2

## 2020-06-23 MED ORDER — AZITHROMYCIN 250 MG PO TABS: 250.0000 mg | ORAL_TABLET | Freq: Every day | ORAL | 0 refills | Status: DC

## 2020-06-23 MED ORDER — IOHEXOL 300 MG/ML  SOLN
100.0000 mL | Freq: Once | INTRAMUSCULAR | Status: AC | PRN
  Administered 2020-06-24: 100 mL via INTRAVENOUS

## 2020-06-23 MED ORDER — ALBUTEROL SULFATE HFA 108 (90 BASE) MCG/ACT IN AERS: 1.0000 | INHALATION_SPRAY | Freq: Four times a day (QID) | RESPIRATORY_TRACT | 0 refills | Status: DC | PRN

## 2020-06-23 MED ORDER — NIRMATRELVIR/RITONAVIR (PAXLOVID)TABLET: 3.0000 | ORAL_TABLET | Freq: Two times a day (BID) | ORAL | 0 refills | Status: DC
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
## 2020-06-23 NOTE — ED Triage Notes (Addendum)
Pt states she was dx with covid 12/30-c/o pain to right rib area-NAD-steady gait

## 2020-06-23 NOTE — Patient Instructions (Signed)
Hold methotrexate dose while sick. Inform rheumatologist about current symptom. Maintain f/up appt 06/27/2020 at 11am (video) Go to urgent care of ED if symptoms worsen. Maintain adequate oral hydration and small frequent meals.  10 Things You Can Do to Manage Your COVID-19 Symptoms at Home If you have possible or confirmed COVID-19: 1. Stay home from work and school. And stay away from other public places. If you must go out, avoid using any kind of public transportation, ridesharing, or taxis. 2. Monitor your symptoms carefully. If your symptoms get worse, call your healthcare provider immediately. 3. Get rest and stay hydrated. 4. If you have a medical appointment, call the healthcare provider ahead of time and tell them that you have or may have COVID-19. 5. For medical emergencies, call 911 and notify the dispatch personnel that you have or may have COVID-19. 6. Cover your cough and sneezes with a tissue or use the inside of your elbow. 7. Wash your hands often with soap and water for at least 20 seconds or clean your hands with an alcohol-based hand sanitizer that contains at least 60% alcohol. 8. As much as possible, stay in a specific room and away from other people in your home. Also, you should use a separate bathroom, if available. If you need to be around other people in or outside of the home, wear a mask. 9. Avoid sharing personal items with other people in your household, like dishes, towels, and bedding. 10. Clean all surfaces that are touched often, like counters, tabletops, and doorknobs. Use household cleaning sprays or wipes according to the label instructions. [email or web address] 12/20/2018 This information is not intended to replace advice given to you by your health care provider. Make sure you discuss any questions you have with your health care provider. Document Revised: 05/24/2019 Document Reviewed: 05/24/2019 Elsevier Patient Education  2020 [employer].

## 2020-06-23 NOTE — Progress Notes (Addendum)
Virtual Visit via Video Note  I connected with@ on 06/23/20 at 11:00 AM EST by a video enabled telemedicine application and verified that I am speaking with the correct person using two identifiers.  Location: Patient:Home Provider: Office Participants: patient and provider  I discussed the limitations of evaluation and management by telemedicine and the availability of in person appointments. I also discussed with the patient that there may be a patient responsible charge related to this service. The patient expressed understanding and agreed to proceed.  CC:Pt tested positive for COVID last week and states she has been experiencing some pain under her ribs, and been having issues breathing. Pt states the symptoms have been bad all week and it is a little better now but the pain in under her left rib is hurting her.   History of Present Illness: Onset of symptoms 06/18/2020. Positive COVID test 06/19/20. Cough This is a new problem. The current episode started in the past 7 days. The problem has been unchanged. The problem occurs constantly. The cough is non-productive. Associated symptoms include chest pain and myalgias. Pertinent negatives include no chills, ear congestion, ear pain, fever, headaches, heartburn, hemoptysis, nasal congestion, postnasal drip, rhinorrhea, sore throat, shortness of breath, sweats, weight loss or wheezing. The symptoms are aggravated by lying down. She has tried OTC cough suppressant for the symptoms. The treatment provided mild relief.  last methotrexate dose 2weeks ago. Has right chest wall pressure without PND or orthopnea or SOB at rest or exertional or palpitation. Unable to provide O2 saturation.  Observations/Objective: Physical Exam Constitutional:      General: She is not in acute distress. Cardiovascular:     Rate and Rhythm: Normal rate.     Pulses: Normal pulses.  Pulmonary:     Effort: Pulmonary effort is normal.  Neurological:     Mental  Status: She is alert and oriented to person, place, and time.    Assessment and Plan: Mili was seen today for acute visit.  Diagnoses and all orders for this visit:  COVID-19 -     azithromycin (ZITHROMAX Z-PAK) 250 MG tablet; Take 1 tablet (250 mg total) by mouth daily. Take 2tabs on first day, then 1tab once a day till complete -     albuterol (VENTOLIN HFA) 108 (90 Base) MCG/ACT inhaler; Inhale 1-2 puffs into the lungs every 6 (six) hours as needed for wheezing or shortness of breath. -     Discontinue: nirmatrelvir/ritonavir EUA (PAXLOVID) TABS; Take 3 tablets by mouth 2 (two) times daily for 5 days. Take nirmatrelvir (150 mg) 2tablet twice daily for 5 days and ritonavir (100 mg) 1 tablet twice daily for 5 days. -     nirmatrelvir/ritonavir EUA (PAXLOVID) TABS; Take 3 tablets by mouth 2 (two) times daily for 5 days. Take nirmatrelvir (150 mg) 2tablet twice daily for 5 days and ritonavir (100 mg) 1 tablet twice daily for 5 days.   Follow Up Instructions: We discussed difference between monoclonal antibody infusion and new oral antiviral-Paxlovid. We dicussed possible side effects, possible drug interactions, and mechanism of action for each treatment. Due to monoclonal antibody shortage, she agreed to take Paxlovid tabs. I provided ED precaution. She verbalized understanding. Hold methotrexate dose while sick. Inform rheumatologist about current symptom. Maintain f/up appt 06/27/2020 at 11am (video) Go to urgent care of ED if symptoms worsen.   I discussed the assessment and treatment plan with the patient. The patient was provided an opportunity to ask questions and all were answered. The  patient agreed with the plan and demonstrated an understanding of the instructions.   The patient was advised to call back or seek an in-person evaluation if the symptoms worsen or if the condition fails to improve as anticipated.  I provided 30 minutes of non-face-to-face time during this  encounter.  Alysia Penna, NP

## 2020-06-24 NOTE — ED Notes (Signed)
Discharge instructions discussed with patient. Verbalized understanding. Departs ED at this time.

## 2020-06-24 NOTE — ED Provider Notes (Signed)
Care assumed from Dr. Adela Lank at shift change.  Patient presenting here with right-sided abdominal pain in the setting of Covid positivity.  She was signed out to me awaiting results of a CT scan.  The CT scan has resulted and there is no evidence for intra-abdominal process.  Stable for discharge.  She is advised to take ibuprofen and follow-up with primary doctor if not improving.   Geoffery Lyons, MD 06/24/20 0130

## 2020-06-24 NOTE — Discharge Instructions (Addendum)
Take ibuprofen 600 mg every 6 hours as needed for pain.  Follow-up with your primary doctor if symptoms or not improving in the next 2 to 3 days.

## 2020-06-25 ENCOUNTER — Ambulatory Visit: Payer: Medicaid Other | Admitting: Nurse Practitioner

## 2020-06-27 ENCOUNTER — Encounter: Payer: Self-pay | Admitting: Nurse Practitioner

## 2020-06-27 ENCOUNTER — Telehealth (INDEPENDENT_AMBULATORY_CARE_PROVIDER_SITE_OTHER): Payer: Medicaid Other | Admitting: Nurse Practitioner

## 2020-06-27 ENCOUNTER — Other Ambulatory Visit: Payer: Self-pay

## 2020-06-27 VITALS — BP 122/89 | HR 87 | Ht 61.0 in | Wt 151.0 lb

## 2020-06-27 DIAGNOSIS — U071 COVID-19: Secondary | ICD-10-CM

## 2020-06-27 MED ORDER — METHYLPREDNISOLONE 4 MG PO TBPK: ORAL_TABLET | ORAL | 0 refills | Status: DC

## 2020-06-27 MED ORDER — BENZONATATE 100 MG PO CAPS: 100.0000 mg | ORAL_CAPSULE | Freq: Three times a day (TID) | ORAL | 0 refills | Status: DC | PRN

## 2020-06-27 NOTE — Progress Notes (Signed)
Virtual Visit via Telephone Note  I connected with@ on 06/27/20 at 11:00 AM EST by a telephone enabled telemedicine application and verified that I am speaking with the correct person using two identifiers.  Location: Patient:Home Provider: Office Participants: patient and provider.  I discussed the limitations of evaluation and management by telemedicine and the availability of in person appointments. I also discussed with the patient that there may be a patient responsible charge related to this service. The patient expressed understanding and agreed to proceed.  CC:Pt states she still has chest tightness, fatigued, shortness of breath when walking around the house. Pt states she went to ED on Monday night due to pain in her chest, pt states pain has gone but still some tightness.   History of Present Illness: COVID infection since 06/18/2020, reports resolved sinus congestion and flank pain. Has persistent cough and SOB with exertion. Denies any palpitation or dizziness or diaphoresis.   Observations/Objective: Physical Exam Vitals reviewed.  Cardiovascular:     Rate and Rhythm: Normal rate.  Neurological:     Mental Status: She is alert and oriented to person, place, and time.    Assessment and Plan: Alyssamae was seen today for acute visit.  Diagnoses and all orders for this visit:  COVID-19 -     methylPREDNISolone (MEDROL DOSEPAK) 4 MG TBPK tablet; Take as directed on package -     benzonatate (TESSALON) 100 MG capsule; Take 1 capsule (100 mg total) by mouth 3 (three) times daily as needed for cough.   Follow Up Instructions: See above. Continue use of albuterol. Work Physicist, medical provided. F/up in 1week Consider referral to post-covid clinic if no improvement.  I discussed the assessment and treatment plan with the patient. The patient was provided an opportunity to ask questions and all were answered. The patient agreed with the plan and demonstrated an understanding of  the instructions.   The patient was advised to call back or seek an in-person evaluation if the symptoms worsen or if the condition fails to improve as anticipated.  I provided 10 minutes of non-face-to-face time during this encounter.  Alysia Penna, NP

## 2020-07-03 ENCOUNTER — Telehealth (INDEPENDENT_AMBULATORY_CARE_PROVIDER_SITE_OTHER): Payer: Medicaid Other | Admitting: Nurse Practitioner

## 2020-07-03 ENCOUNTER — Encounter: Payer: Self-pay | Admitting: Nurse Practitioner

## 2020-07-03 VITALS — Ht 61.0 in | Wt 148.0 lb

## 2020-07-03 DIAGNOSIS — U071 COVID-19: Secondary | ICD-10-CM | POA: Diagnosis not present

## 2020-07-03 NOTE — Patient Instructions (Signed)
Call office and schedule appt for CPE Ok to return to work without any restrictions Ok to take booster vaccine in 69months Resume methotrexate next Thursday.

## 2020-07-03 NOTE — Progress Notes (Signed)
Virtual Visit via Telephone Note  I connected with Lindsey Carlson on 07/03/20 at 10:00 AM EST by telephone and verified that I am speaking with the correct person using two identifiers.  Location: Patient:home Provider: office Participants: patient and povider.  I discussed the limitations, risks, security and privacy concerns of performing an evaluation and management service by telephone and the availability of in person appointments. I also discussed with the patient that there may be a patient responsible charge related to this service. The patient expressed understanding and agreed to proceed.  CC:f/up on COVID symptoms  History of Present Illness: Ms. Lindsey Carlson is unable to contact via video. She reports complete resolution of cough, chect tightness and SOB with albuterol and oral prednisone. No fever or diaphoresis or sinus congestion. She also received a negative COVID test on 06/27/2020.   Observations/Objective: Alert and oriented Normal speech  Assessment and Plan: Lindsey Carlson was seen today for acute visit.  Diagnoses and all orders for this visit:  COVID-19   Follow Up Instructions: Call office and schedule appt for CPE Ok to return to work without any restrictions Ok to take booster vaccine in 26months Resume methotrexate next Thursday.   I discussed the assessment and treatment plan with the patient. The patient was provided an opportunity to ask questions and all were answered. The patient agreed with the plan and demonstrated an understanding of the instructions.   The patient was advised to call back or seek an in-person evaluation if the symptoms worsen or if the condition fails to improve as anticipated.  I provided of non-face-to-face time during this encounter.   Alysia Penna, NP

## 2020-07-04 DIAGNOSIS — Z1231 Encounter for screening mammogram for malignant neoplasm of breast: Secondary | ICD-10-CM

## 2020-07-06 ENCOUNTER — Encounter: Payer: Self-pay | Admitting: Nurse Practitioner

## 2020-07-15 ENCOUNTER — Ambulatory Visit: Payer: Medicaid Other | Admitting: Nurse Practitioner

## 2020-07-15 ENCOUNTER — Other Ambulatory Visit: Payer: Self-pay | Admitting: Nurse Practitioner

## 2020-07-15 DIAGNOSIS — U071 COVID-19: Secondary | ICD-10-CM

## 2020-08-04 ENCOUNTER — Encounter: Payer: Self-pay | Admitting: Nurse Practitioner

## 2020-08-04 ENCOUNTER — Other Ambulatory Visit: Payer: Self-pay

## 2020-08-04 ENCOUNTER — Ambulatory Visit (INDEPENDENT_AMBULATORY_CARE_PROVIDER_SITE_OTHER): Payer: Medicaid Other | Admitting: Nurse Practitioner

## 2020-08-04 VITALS — BP 100/70 | HR 86 | Temp 97.1°F | Wt 154.0 lb

## 2020-08-04 DIAGNOSIS — K5900 Constipation, unspecified: Secondary | ICD-10-CM

## 2020-08-04 MED ORDER — POLYETHYLENE GLYCOL 3350 17 GM/SCOOP PO POWD: 17.0000 g | Freq: Every day | ORAL | 1 refills | Status: DC

## 2020-08-04 NOTE — Progress Notes (Signed)
   Subjective:  Patient ID: Lindsey Carlson, female    DOB: [birth year]  Age: 40 y.o. MRN: [id number]  CC: Acute Visit (Pt c/o bloating x1 week. Pt states it is due to constipation and would like to discuss ways to help with this.)  Constipation This is a new problem. The current episode started 1 to 4 weeks ago. The problem is unchanged. Her stool frequency is 2 to 3 times per week. The stool is described as pellet like. The patient is on a high fiber diet. She exercises regularly. There has been adequate water intake. Associated symptoms include abdominal pain and bloating. Pertinent negatives include no anorexia, back pain, diarrhea, difficulty urinating, fecal incontinence, fever, flatus, hematochezia, hemorrhoids, melena, nausea, rectal pain, vomiting or weight loss. She has tried laxatives for the symptoms. The treatment provided no relief. There is no history of abdominal surgery, endocrine disease, irritable bowel syndrome, metabolic disease or neuromuscular disease.   Reviewed past Medical, Social and Family history today.  Outpatient Medications Prior to Visit  Medication Sig Dispense Refill  . folic acid (FOLVITE) 1 MG tablet Take 1 mg by mouth daily.    Marland Kitchen ibuprofen (ADVIL) 800 MG tablet Take 1 tablet (800 mg total) by mouth 3 (three) times daily. 30 tablet 0  . methotrexate (RHEUMATREX) 2.5 MG tablet Take 15 mg by mouth once a week.    . Multiple Vitamin (MULTI VITAMIN DAILY PO) Take by mouth.    Marland Kitchen PROAIR HFA 108 (90 Base) MCG/ACT inhaler INHALE 1-2 PUFFS BY MOUTH EVERY 6 HOURS AS NEEDED FOR WHEEZE OR SHORTNESS OF BREATH 8.5 each 0   No facility-administered medications prior to visit.    ROS See HPI  Objective:  BP 100/70 (BP Location: Left Arm, Patient Position: Sitting, Cuff Size: Normal)   Pulse 86   Temp (!) 97.1 F (36.2 C) (Temporal)   Wt 154 lb (69.9 kg)   SpO2 98%   BMI 29.10 kg/m   Physical Exam Vitals reviewed.  Cardiovascular:     Rate and Rhythm: Normal rate.      Pulses: Normal pulses.  Pulmonary:     Effort: Pulmonary effort is normal.  Abdominal:     General: Bowel sounds are normal. There is no distension.     Palpations: Abdomen is soft. There is no mass.     Tenderness: There is no abdominal tenderness. There is no guarding.     Hernia: No hernia is present.  Neurological:     Mental Status: She is alert and oriented to person, place, and time.    Assessment & Plan:  This visit occurred during the SARS-CoV-2 public health emergency.  Safety protocols were in place, including screening questions prior to the visit, additional usage of staff PPE, and extensive cleaning of exam room while observing appropriate contact time as indicated for disinfecting solutions.   Lindsey Carlson was seen today for acute visit.  Diagnoses and all orders for this visit:  Constipation, unspecified constipation type -     polyethylene glycol powder (GLYCOLAX/MIRALAX) 17 GM/SCOOP powder; Take 17 g by mouth daily.    Problem List Items Addressed This Visit   None   Visit Diagnoses    Constipation, unspecified constipation type    -  Primary   Relevant Medications   polyethylene glycol powder (GLYCOLAX/MIRALAX) 17 GM/SCOOP powder      Follow-up: Return in about 4 weeks (around 09/01/2020) for CPE (fasting).  Alysia Penna, NP

## 2020-08-04 NOTE — Patient Instructions (Signed)
Start IBguard 1cap daily. Call office if no improvement in 1week.  Constipation, Adult Constipation is when a person has fewer than three bowel movements in a week, has difficulty having a bowel movement, or has stools (feces) that are dry, hard, or larger than normal. Constipation may be caused by an underlying condition. It may become worse with age if a person takes certain medicines and does not take in enough fluids. Follow these instructions at home: Eating and drinking  Eat foods that have a lot of fiber, such as beans, whole grains, and fresh fruits and vegetables.  Limit foods that are low in fiber and high in fat and processed sugars, such as fried or sweet foods. These include french fries, hamburgers, cookies, candies, and soda.  Drink enough fluid to keep your urine pale yellow.   General instructions  Exercise regularly or as told by your health care provider. Try to do 150 minutes of moderate exercise each week.  Use the bathroom when you have the urge to go. Do not hold it in.  Take over-the-counter and prescription medicines only as told by your health care provider. This includes any fiber supplements.  During bowel movements: ? Practice deep breathing while relaxing the lower abdomen. ? Practice pelvic floor relaxation.  Watch your condition for any changes. Let your health care provider know about them.  Keep all follow-up visits as told by your health care provider. This is important. Contact a health care provider if:  You have pain that gets worse.  You have a fever.  You do not have a bowel movement after 4 days.  You vomit.  You are not hungry or you lose weight.  You are bleeding from the opening between the buttocks (anus).  You have thin, pencil-like stools. Get help right away if:  You have a fever and your symptoms suddenly get worse.  You leak stool or have blood in your stool.  Your abdomen is bloated.  You have severe pain in your  abdomen.  You feel dizzy or you faint. Summary  Constipation is when a person has fewer than three bowel movements in a week, has difficulty having a bowel movement, or has stools (feces) that are dry, hard, or larger than normal.  Eat foods that have a lot of fiber, such as beans, whole grains, and fresh fruits and vegetables.  Drink enough fluid to keep your urine pale yellow.  Take over-the-counter and prescription medicines only as told by your health care provider. This includes any fiber supplements. This information is not intended to replace advice given to you by your health care provider. Make sure you discuss any questions you have with your health care provider. Document Revised: 04/25/2019 Document Reviewed: 04/25/2019 Elsevier Patient Education  2021 [employer].

## 2020-09-10 ENCOUNTER — Other Ambulatory Visit: Payer: Self-pay

## 2020-09-10 ENCOUNTER — Encounter: Payer: Self-pay | Admitting: Nurse Practitioner

## 2020-09-10 ENCOUNTER — Ambulatory Visit (INDEPENDENT_AMBULATORY_CARE_PROVIDER_SITE_OTHER): Payer: Medicaid Other | Admitting: Nurse Practitioner

## 2020-09-10 VITALS — BP 100/70 | HR 88 | Temp 96.9°F | Ht 61.0 in | Wt 157.4 lb

## 2020-09-10 DIAGNOSIS — R109 Unspecified abdominal pain: Secondary | ICD-10-CM | POA: Diagnosis not present

## 2020-09-10 DIAGNOSIS — G8929 Other chronic pain: Secondary | ICD-10-CM | POA: Diagnosis not present

## 2020-09-10 MED ORDER — IBUPROFEN 600 MG PO TABS: 600.0000 mg | ORAL_TABLET | Freq: Three times a day (TID) | ORAL | 0 refills | Status: DC | PRN

## 2020-09-10 NOTE — Patient Instructions (Signed)
Use ibuprofen as prescribed Alternated between warm and cold compress Start back exercise once a day When you return from your trip, call office for PT referral.  Back Exercises The following exercises strengthen the muscles that help to support the trunk and back. They also help to keep the lower back flexible. Doing these exercises can help to prevent back pain or lessen existing pain.  If you have back pain or discomfort, try doing these exercises 2-3 times each day or as told by your health care provider.  As your pain improves, do them once each day, but increase the number of times that you repeat the steps for each exercise (do more repetitions).  To prevent the recurrence of back pain, continue to do these exercises once each day or as told by your health care provider. Do exercises exactly as told by your health care provider and adjust them as directed. It is normal to feel mild stretching, pulling, tightness, or discomfort as you do these exercises, but you should stop right away if you feel sudden pain or your pain gets worse. Exercises Single knee to chest Repeat these steps 3-5 times for each leg: 1. Lie on your back on a firm bed or the floor with your legs extended. 2. Bring one knee to your chest. Your other leg should stay extended and in contact with the floor. 3. Hold your knee in place by grabbing your knee or thigh with both hands and hold. 4. Pull on your knee until you feel a gentle stretch in your lower back or buttocks. 5. Hold the stretch for 10-30 seconds. 6. Slowly release and straighten your leg. Pelvic tilt Repeat these steps 5-10 times: 1. Lie on your back on a firm bed or the floor with your legs extended. 2. Bend your knees so they are pointing toward the ceiling and your feet are flat on the floor. 3. Tighten your lower abdominal muscles to press your lower back against the floor. This motion will tilt your pelvis so your tailbone points up toward the  ceiling instead of pointing to your feet or the floor. 4. With gentle tension and even breathing, hold this position for 5-10 seconds. Cat-cow Repeat these steps until your lower back becomes more flexible: 1. Get into a hands-and-knees position on a firm surface. Keep your hands under your shoulders, and keep your knees under your hips. You may place padding under your knees for comfort. 2. Let your head hang down toward your chest. Contract your abdominal muscles and point your tailbone toward the floor so your lower back becomes rounded like the back of a cat. 3. Hold this position for 5 seconds. 4. Slowly lift your head, let your abdominal muscles relax and point your tailbone up toward the ceiling so your back forms a sagging arch like the back of a cow. 5. Hold this position for 5 seconds.   Press-ups Repeat these steps 5-10 times: 1. Lie on your abdomen (face-down) on the floor. 2. Place your palms near your head, about shoulder-width apart. 3. Keeping your back as relaxed as possible and keeping your hips on the floor, slowly straighten your arms to raise the top half of your body and lift your shoulders. Do not use your back muscles to raise your upper torso. You may adjust the placement of your hands to make yourself more comfortable. 4. Hold this position for 5 seconds while you keep your back relaxed. 5. Slowly return to lying flat on the  floor.   Bridges Repeat these steps 10 times: 1. Lie on your back on a firm surface. 2. Bend your knees so they are pointing toward the ceiling and your feet are flat on the floor. Your arms should be flat at your sides, next to your body. 3. Tighten your buttocks muscles and lift your buttocks off the floor until your waist is at almost the same height as your knees. You should feel the muscles working in your buttocks and the back of your thighs. If you do not feel these muscles, slide your feet 1-2 inches farther away from your buttocks. 4. Hold  this position for 3-5 seconds. 5. Slowly lower your hips to the starting position, and allow your buttocks muscles to relax completely. If this exercise is too easy, try doing it with your arms crossed over your chest.   Abdominal crunches Repeat these steps 5-10 times: 1. Lie on your back on a firm bed or the floor with your legs extended. 2. Bend your knees so they are pointing toward the ceiling and your feet are flat on the floor. 3. Cross your arms over your chest. 4. Tip your chin slightly toward your chest without bending your neck. 5. Tighten your abdominal muscles and slowly raise your trunk (torso) high enough to lift your shoulder blades a tiny bit off the floor. Avoid raising your torso higher than that because it can put too much stress on your low back and does not help to strengthen your abdominal muscles. 6. Slowly return to your starting position. Back lifts Repeat these steps 5-10 times: 1. Lie on your abdomen (face-down) with your arms at your sides, and rest your forehead on the floor. 2. Tighten the muscles in your legs and your buttocks. 3. Slowly lift your chest off the floor while you keep your hips pressed to the floor. Keep the back of your head in line with the curve in your back. Your eyes should be looking at the floor. 4. Hold this position for 3-5 seconds. 5. Slowly return to your starting position. Contact a health care provider if:  Your back pain or discomfort gets much worse when you do an exercise.  Your worsening back pain or discomfort does not lessen within 2 hours after you exercise. If you have any of these problems, stop doing these exercises right away. Do not do them again unless your health care provider says that you can. Get help right away if:  You develop sudden, severe back pain. If this happens, stop doing the exercises right away. Do not do them again unless your health care provider says that you can. This information is not intended to  replace advice given to you by your health care provider. Make sure you discuss any questions you have with your health care provider. Document Revised: 10/12/2018 Document Reviewed: 03/09/2018 Elsevier Patient Education  2021 [employer].

## 2020-09-10 NOTE — Progress Notes (Signed)
Subjective:  Patient ID: Lindsey Carlson, female    DOB: 07/18/1980  Age: 40 y.o. MRN: [id number]  CC: Acute Visit (Pt c/o pain on the right side x 3 months. Pt states pain started when she got COVID and never went away. )  HPI  [name] presents with persistent intermittent right flank pain since 05/2021, onset after COVID infection. Worse with activity and constipation. Improves with use of ibuprofen. Describes as pressure.Constipation improved with use of miralax as needed  Denies any cough, SOB, fever, hematuria or rash or nausea or ABD pain or paresthesia or swelling. Normal CT ABD/pelvis on 06/2020.  Reviewed past Medical, Social and Family history today.  Outpatient Medications Prior to Visit  Medication Sig Dispense Refill  . folic acid (FOLVITE) 1 MG tablet Take 1 mg by mouth daily.    . methotrexate (RHEUMATREX) 2.5 MG tablet Take 15 mg by mouth once a week.    . Multiple Vitamin (MULTI VITAMIN DAILY PO) Take by mouth.    . polyethylene glycol powder (GLYCOLAX/MIRALAX) 17 GM/SCOOP powder Take 17 g by mouth daily. 507 g 1  . ibuprofen (ADVIL) 800 MG tablet Take 1 tablet (800 mg total) by mouth 3 (three) times daily. 30 tablet 0   No facility-administered medications prior to visit.    ROS See HPI  Objective:  BP 100/70 (BP Location: Left Arm, Patient Position: Sitting, Cuff Size: Normal)   Pulse 88   Temp (!) 96.9 F (36.1 C) (Temporal)   Ht 5\' 1"  (1.549 m)   Wt 157 lb 6.4 oz (71.4 kg)   SpO2 99%   BMI 29.74 kg/m   Physical Exam Vitals reviewed.  Cardiovascular:     Rate and Rhythm: Normal rate and regular rhythm.     Pulses: Normal pulses.     Heart sounds: Normal heart sounds.  Pulmonary:     Effort: Pulmonary effort is normal.     Breath sounds: Normal breath sounds.  Abdominal:     General: There is no distension.     Palpations: Abdomen is soft.     Tenderness: There is no abdominal tenderness. There is no right CVA tenderness, left CVA tenderness or  guarding.  Musculoskeletal:        General: No swelling, tenderness or deformity.     Cervical back: Normal range of motion and neck supple.  Skin:    Findings: No erythema or rash.  Neurological:     Mental Status: She is alert and oriented to person, place, and time.    Assessment & Plan:  This visit occurred during the SARS-CoV-2 public health emergency.  Safety protocols were in place, including screening questions prior to the visit, additional usage of staff PPE, and extensive cleaning of exam room while observing appropriate contact time as indicated for disinfecting solutions.   Rand was seen today for acute visit.  Diagnoses and all orders for this visit:  Chronic right flank pain -     ibuprofen (ADVIL) 600 MG tablet; Take 1 tablet (600 mg total) by mouth every 8 (eight) hours as needed (with food).  She is leaving 2hrs away for work and will be gone for next 60months, hence unable to participate in outpatient PT at this time. Use ibuprofen as prescribed Alternated between warm and cold compress Start back exercise once a day    When you return from your trip, call office for PT referral.  Problem List Items Addressed This Visit   None   Visit Diagnoses  Chronic right flank pain    -  Primary   Relevant Medications   ibuprofen (ADVIL) 600 MG tablet      Follow-up: No follow-ups on file.  Alysia Penna, NP

## 2020-09-13 ENCOUNTER — Encounter: Payer: Self-pay | Admitting: Nurse Practitioner

## 2020-09-30 ENCOUNTER — Encounter: Payer: Self-pay | Admitting: Nurse Practitioner

## 2021-01-13 ENCOUNTER — Ambulatory Visit: Payer: Medicaid Other | Admitting: Nurse Practitioner

## 2021-02-26 ENCOUNTER — Ambulatory Visit
Admission: EM | Admit: 2021-02-26 | Discharge: 2021-02-26 | Disposition: A | Discharge status: Home / Self Care | Payer: Medicaid Other | Attending: Urgent Care | Admitting: Urgent Care

## 2021-02-26 ENCOUNTER — Other Ambulatory Visit: Payer: Self-pay

## 2021-02-26 DIAGNOSIS — M5442 Lumbago with sciatica, left side: Secondary | ICD-10-CM | POA: Diagnosis not present

## 2021-02-26 DIAGNOSIS — S39012A Strain of muscle, fascia and tendon of lower back, initial encounter: Secondary | ICD-10-CM

## 2021-02-26 DIAGNOSIS — Z8739 Personal history of other diseases of the musculoskeletal system and connective tissue: Secondary | ICD-10-CM

## 2021-02-26 MED ORDER — TIZANIDINE HCL 4 MG PO TABS: 4.0000 mg | ORAL_TABLET | Freq: Every day | ORAL | 0 refills | Status: DC

## 2021-02-26 MED ORDER — PREDNISONE 20 MG PO TABS: ORAL_TABLET | ORAL | 0 refills | Status: DC

## 2021-02-26 MED ORDER — NAPROXEN 500 MG PO TABS: 500.0000 mg | ORAL_TABLET | Freq: Two times a day (BID) | ORAL | 0 refills | Status: DC
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
## 2021-02-26 NOTE — ED Triage Notes (Signed)
Pt c/o bottom left back pain described as 7/10 "pinching" pain Onset yesterday. States tried motrin, tylenol, and salanpos patches at home without relief.   Pt is unsure of what the cause of this may be, thinks it may be work related.

## 2021-03-10 ENCOUNTER — Encounter: Payer: Self-pay | Admitting: Nurse Practitioner

## 2021-03-10 ENCOUNTER — Ambulatory Visit (INDEPENDENT_AMBULATORY_CARE_PROVIDER_SITE_OTHER): Payer: Medicaid Other | Admitting: Nurse Practitioner

## 2021-03-10 ENCOUNTER — Other Ambulatory Visit: Payer: Self-pay

## 2021-03-10 ENCOUNTER — Other Ambulatory Visit (HOSPITAL_COMMUNITY)
Admission: RE | Admit: 2021-03-10 | Discharge: 2021-03-10 | Disposition: A | Discharge status: Home / Self Care | Payer: Medicaid Other | Source: Ambulatory Visit | Attending: Nurse Practitioner | Admitting: Nurse Practitioner

## 2021-03-10 VITALS — BP 132/80 | HR 76 | Temp 98.2°F | Ht 61.0 in | Wt 154.4 lb

## 2021-03-10 DIAGNOSIS — Z0000 Encounter for general adult medical examination without abnormal findings: Secondary | ICD-10-CM

## 2021-03-10 DIAGNOSIS — Z124 Encounter for screening for malignant neoplasm of cervix: Secondary | ICD-10-CM | POA: Diagnosis present

## 2021-03-10 DIAGNOSIS — Z113 Encounter for screening for infections with a predominantly sexual mode of transmission: Secondary | ICD-10-CM | POA: Diagnosis not present

## 2021-03-10 DIAGNOSIS — Z136 Encounter for screening for cardiovascular disorders: Secondary | ICD-10-CM | POA: Diagnosis not present

## 2021-03-10 DIAGNOSIS — Z13220 Encounter for screening for lipoid disorders: Secondary | ICD-10-CM | POA: Diagnosis not present

## 2021-03-10 DIAGNOSIS — Z1231 Encounter for screening mammogram for malignant neoplasm of breast: Secondary | ICD-10-CM

## 2021-03-10 DIAGNOSIS — N760 Acute vaginitis: Secondary | ICD-10-CM

## 2021-03-10 DIAGNOSIS — Z23 Encounter for immunization: Secondary | ICD-10-CM

## 2021-03-10 LAB — CBC WITH DIFFERENTIAL/PLATELET
Basophils Absolute: 0.1 10*3/uL (ref 0.0–0.1)
Basophils Relative: 0.8 % (ref 0.0–3.0)
Eosinophils Absolute: 0.1 10*3/uL (ref 0.0–0.7)
Eosinophils Relative: 1.8 % (ref 0.0–5.0)
HCT: 38.8 % (ref 36.0–46.0)
Hemoglobin: 12.7 g/dL (ref 12.0–15.0)
Lymphocytes Relative: 35.8 % (ref 12.0–46.0)
Lymphs Abs: 2.3 10*3/uL (ref 0.7–4.0)
MCHC: 32.8 g/dL (ref 30.0–36.0)
MCV: 82.9 fl (ref 78.0–100.0)
Monocytes Absolute: 0.5 10*3/uL (ref 0.1–1.0)
Monocytes Relative: 7.5 % (ref 3.0–12.0)
Neutro Abs: 3.5 10*3/uL (ref 1.4–7.7)
Neutrophils Relative %: 54.1 % (ref 43.0–77.0)
Platelets: 299 10*3/uL (ref 150.0–400.0)
RBC: 4.67 Mil/uL (ref 3.87–5.11)
RDW: 13.7 % (ref 11.5–15.5)
WBC: 6.5 10*3/uL (ref 4.0–10.5)

## 2021-03-10 LAB — COMPREHENSIVE METABOLIC PANEL
ALT: 13 U/L (ref 0–35)
AST: 17 U/L (ref 0–37)
Albumin: 4.2 g/dL (ref 3.5–5.2)
Alkaline Phosphatase: 66 U/L (ref 39–117)
BUN: 10 mg/dL (ref 6–23)
CO2: 27 mEq/L (ref 19–32)
Calcium: 9.8 mg/dL (ref 8.4–10.5)
Chloride: 105 mEq/L (ref 96–112)
Creatinine, Ser: 0.64 mg/dL (ref 0.40–1.20)
GFR: 109.41 mL/min (ref >60.00)
Glucose, Bld: 88 mg/dL (ref 70–99)
Potassium: 3.8 mEq/L (ref 3.5–5.1)
Sodium: 140 mEq/L (ref 135–145)
Total Bilirubin: 0.4 mg/dL (ref 0.2–1.2)
Total Protein: 7.7 g/dL (ref 6.0–8.3)

## 2021-03-10 LAB — LIPID PANEL
Cholesterol: 196 mg/dL (ref 0–200)
HDL: 47.9 mg/dL (ref >39.00)
LDL Cholesterol: 124 mg/dL — ABNORMAL HIGH (ref 0–99)
NonHDL: 148.5
Total CHOL/HDL Ratio: 4
Triglycerides: 125 mg/dL (ref 0.0–149.0)
VLDL: 25 mg/dL (ref 0.0–40.0)

## 2021-03-10 LAB — TSH: TSH: 2.45 u[IU]/mL (ref 0.35–5.50)

## 2021-03-10 NOTE — Patient Instructions (Signed)
Go to lab for blood draw Schedule appt for annual mammogram in December Let me know which rheumatologist is within your insurance network. Minimize caffeine consumption to help with breast pain Use miralax daily to help with constipation.  Preventive Care 21-42 Years Old, Female Preventive care refers to lifestyle choices and visits with your health care provider that can promote health and wellness. This includes: A yearly physical exam. This is also called an annual wellness visit. Regular dental and eye exams. Immunizations. Screening for certain conditions. Healthy lifestyle choices, such as: Eating a healthy diet. Getting regular exercise. Not using drugs or products that contain nicotine and tobacco. Limiting alcohol use. What can I expect for my preventive care visit? Physical exam Your health care provider will check your: Height and weight. These may be used to calculate your BMI (body mass index). BMI is a measurement that tells if you are at a healthy weight. Heart rate and blood pressure. Body temperature. Skin for abnormal spots. Counseling Your health care provider may ask you questions about your: Past medical problems. Family's medical history. Alcohol, tobacco, and drug use. Emotional well-being. Home life and relationship well-being. Sexual activity. Diet, exercise, and sleep habits. Work and work Statistician. Access to firearms. Method of birth control. Menstrual cycle. Pregnancy history. What immunizations do I need? Vaccines are usually given at various ages, according to a schedule. Your health care provider will recommend vaccines for you based on your age, medical history, and lifestyle or other factors, such as travel or where you work. What tests do I need? Blood tests Lipid and cholesterol levels. These may be checked every 5 years, or more often if you are over 30 years old. Hepatitis C test. Hepatitis B test. Screening Lung cancer screening.  You may have this screening every year starting at age 62 if you have a 30-pack-year history of smoking and currently smoke or have quit within the past 15 years. Colorectal cancer screening. All adults should have this screening starting at age 57 and continuing until age 26. Your health care provider may recommend screening at age 61 if you are at increased risk. You will have tests every 1-10 years, depending on your results and the type of screening test. Diabetes screening. This is done by checking your blood sugar (glucose) after you have not eaten for a while (fasting). You may have this done every 1-3 years. Mammogram. This may be done every 1-2 years. Talk with your health care provider about when you should start having regular mammograms. This may depend on whether you have a family history of breast cancer. BRCA-related cancer screening. This may be done if you have a family history of breast, ovarian, tubal, or peritoneal cancers. Pelvic exam and Pap test. This may be done every 3 years starting at age 3. Starting at age 70, this may be done every 5 years if you have a Pap test in combination with an HPV test. Other tests STD (sexually transmitted disease) testing, if you are at risk. Bone density scan. This is done to screen for osteoporosis. You may have this scan if you are at high risk for osteoporosis. Talk with your health care provider about your test results, treatment options, and if necessary, the need for more tests. Follow these instructions at home: Eating and drinking  Eat a diet that includes fresh fruits and vegetables, whole grains, lean protein, and low-fat dairy products. Take vitamin and mineral supplements as recommended by your health care provider. Do not  drink alcohol if: Your health care provider tells you not to drink. You are pregnant, may be pregnant, or are planning to become pregnant. If you drink alcohol: Limit how much you have to 0-1 drink a  day. Be aware of how much alcohol is in your drink. In the [place] one drink equals one 12 oz bottle of beer (355 mL), one 5 oz glass of wine (148 mL), or one 1 oz glass of hard liquor (44 mL). Lifestyle Take daily care of your teeth and gums. Brush your teeth every morning and night with fluoride toothpaste. Floss one time each day. Stay active. Exercise for at least 30 minutes 5 or more days each week. Do not use any products that contain nicotine or tobacco, such as cigarettes, e-cigarettes, and chewing tobacco. If you need help quitting, ask your health care provider. Do not use drugs. If you are sexually active, practice safe sex. Use a condom or other form of protection to prevent STIs (sexually transmitted infections). If you do not wish to become pregnant, use a form of birth control. If you plan to become pregnant, see your health care provider for a prepregnancy visit. If told by your health care provider, take low-dose aspirin daily starting at age 60. Find healthy ways to cope with stress, such as: Meditation, yoga, or listening to music. Journaling. Talking to a trusted person. Spending time with friends and family. Safety Always wear your seat belt while driving or riding in a vehicle. Do not drive: If you have been drinking alcohol. Do not ride with someone who has been drinking. When you are tired or distracted. While texting. Wear a helmet and other protective equipment during sports activities. If you have firearms in your house, make sure you follow all gun safety procedures. What's next? Visit your health care provider once a year for an annual wellness visit. Ask your health care provider how often you should have your eyes and teeth checked. Stay up to date on all vaccines. This information is not intended to replace advice given to you by your health care provider. Make sure you discuss any questions you have with your health care provider. Document Revised:  08/15/2020 Document Reviewed: 02/16/2018 Elsevier Patient Education  2022 [employer].
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
## 2021-03-11 ENCOUNTER — Encounter: Payer: Self-pay | Admitting: Nurse Practitioner

## 2021-03-11 LAB — CERVICOVAGINAL ANCILLARY ONLY
Bacterial Vaginitis (gardnerella): POSITIVE — AB
Candida Glabrata: NEGATIVE
Candida Vaginitis: POSITIVE — AB
Chlamydia: NEGATIVE
Comment: NEGATIVE
Comment: NEGATIVE
Comment: NEGATIVE
Comment: NEGATIVE
Comment: NEGATIVE
Comment: NORMAL
Neisseria Gonorrhea: NEGATIVE
Trichomonas: NEGATIVE

## 2021-03-11 LAB — CYTOLOGY - PAP
Comment: NEGATIVE
Diagnosis: NEGATIVE
High risk HPV: NEGATIVE

## 2021-03-11 LAB — HEPATITIS C ANTIBODY
Hepatitis C Ab: NONREACTIVE
SIGNAL TO CUT-OFF: 0.01 (ref <1.00)

## 2021-03-11 LAB — RPR: RPR Ser Ql: NONREACTIVE

## 2021-03-11 LAB — HIV ANTIBODY (ROUTINE TESTING W REFLEX): HIV 1&2 Ab, 4th Generation: NONREACTIVE

## 2021-03-13 MED ORDER — METRONIDAZOLE 0.75 % VA GEL: 1.0000 | Freq: Every day | VAGINAL | 0 refills | Status: DC

## 2021-03-13 MED ORDER — FLUCONAZOLE 150 MG PO TABS: 150.0000 mg | ORAL_TABLET | Freq: Every day | ORAL | 0 refills | Status: DC

## 2021-03-13 NOTE — Addendum Note (Signed)
Addended by: Michaela Corner on: 03/13/2021 09:17 AM   Modules accepted: Orders

## 2021-03-17 IMAGING — MG DIGITAL DIAGNOSTIC BILAT W/ TOMO W/ CAD
8 series · 8 of 24 positions shown · non-contrast
Comparison: Previous exam(s).

CLINICAL DATA: 39-year-old female with diffuse LEFT breast pain and
for delayed follow-up of LEFT breast mass.

EXAM:
DIGITAL DIAGNOSTIC BILATERAL MAMMOGRAM WITH CAD AND TOMO
ULTRASOUND LEFT BREAST

[L CC synth-2D]
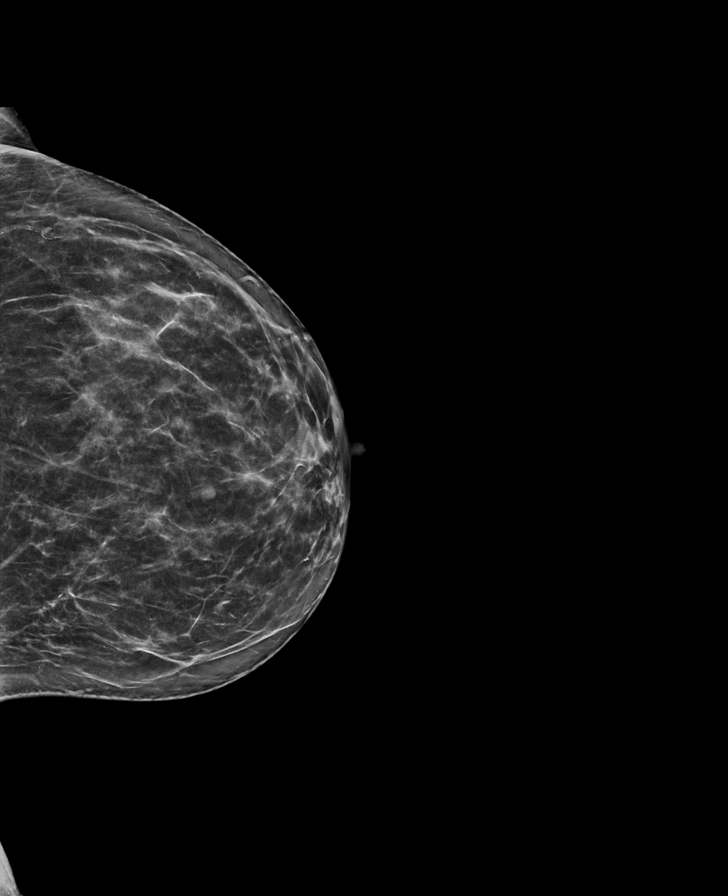

[R MLO synth-2D]
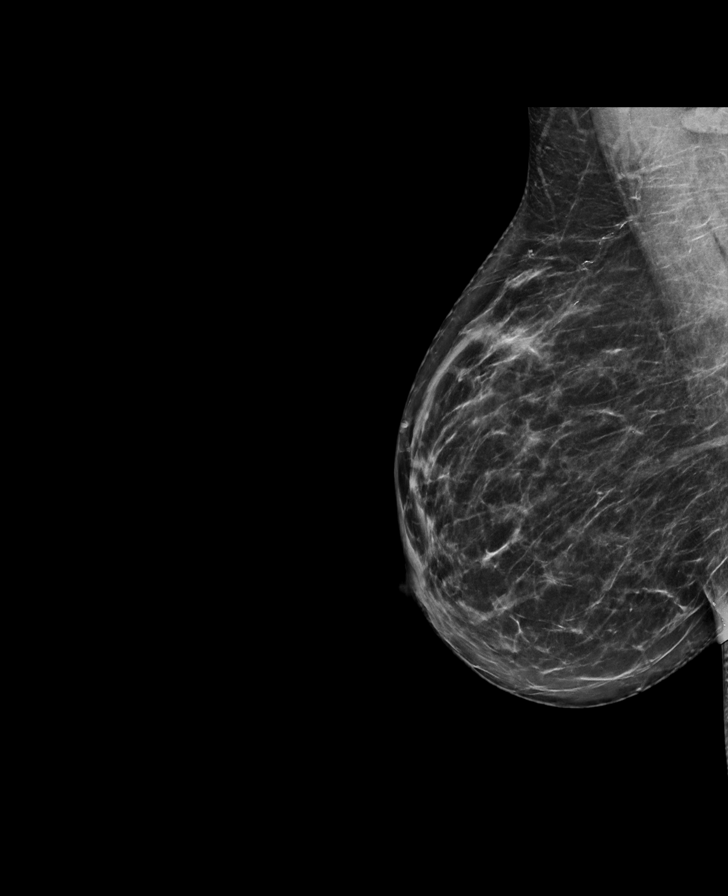

[L MLO synth-2D]
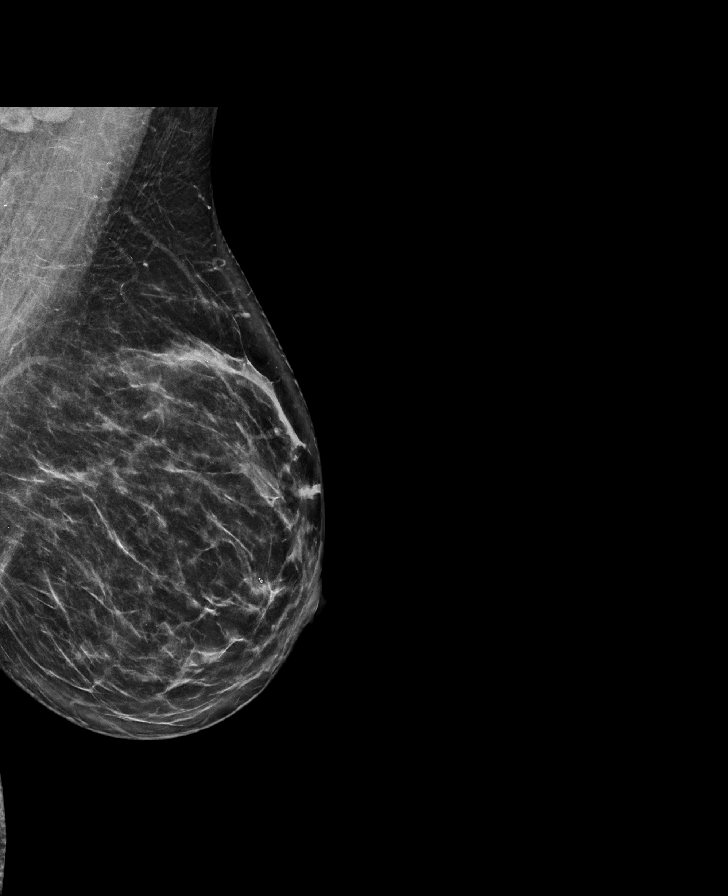

[R CC synth-2D]
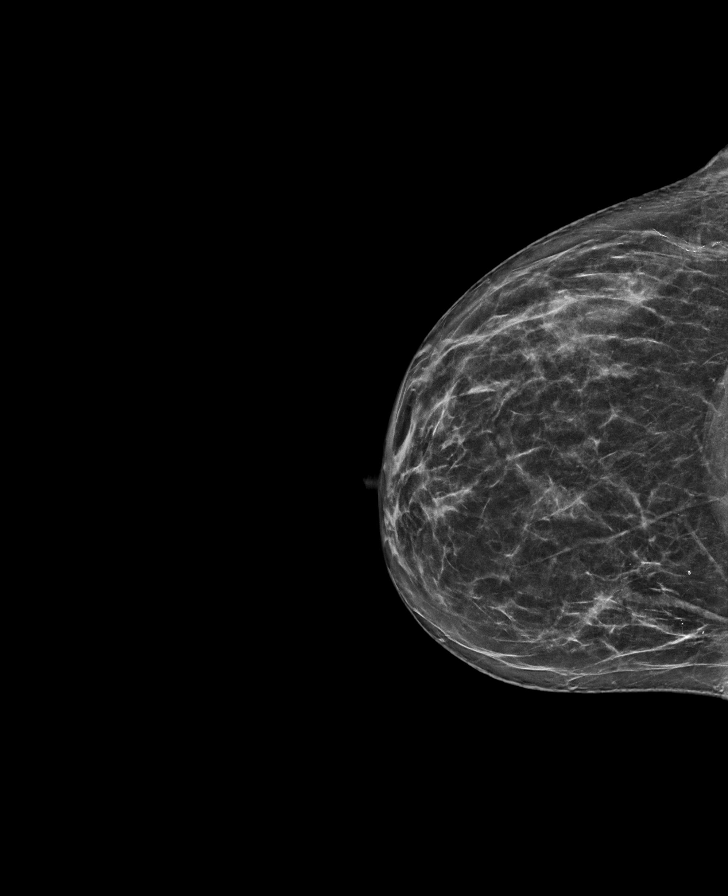

[R CC tomo · tomo slice 33/66.0]
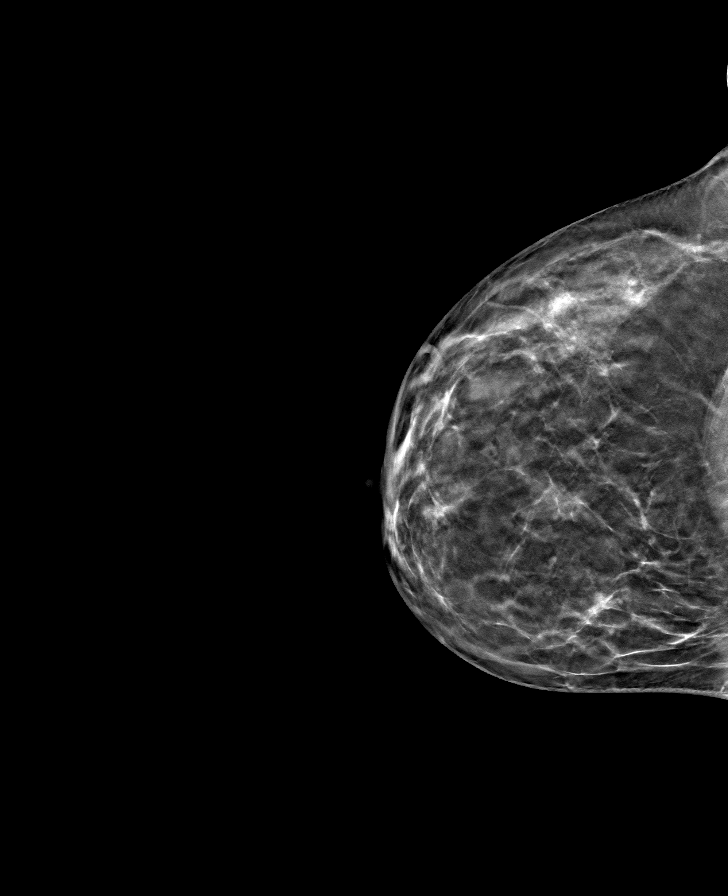

[L MLO tomo · tomo slice 37/74.0]
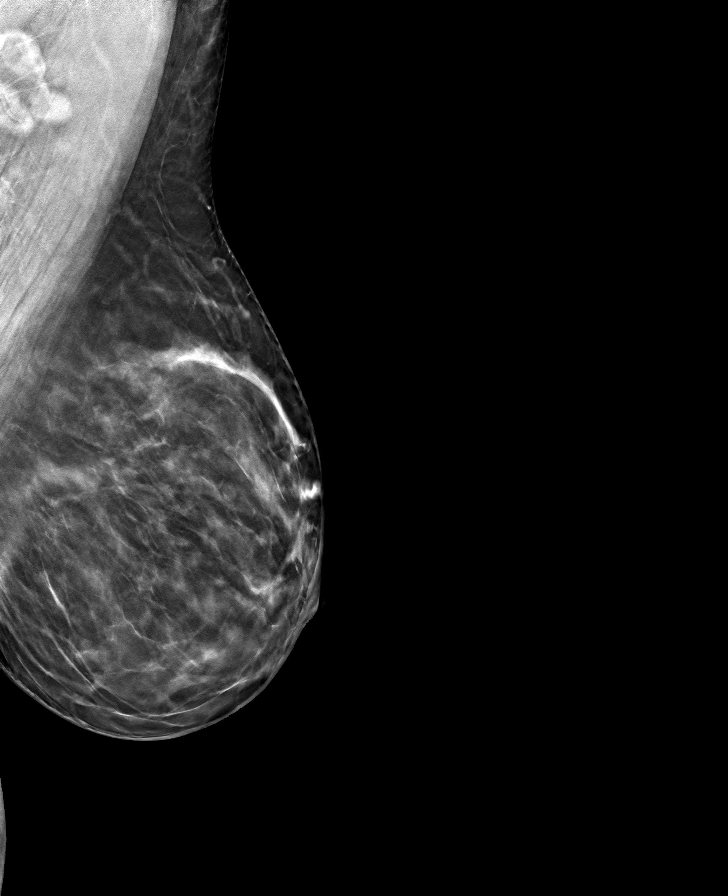

[L CC tomo · tomo slice 33/66.0]
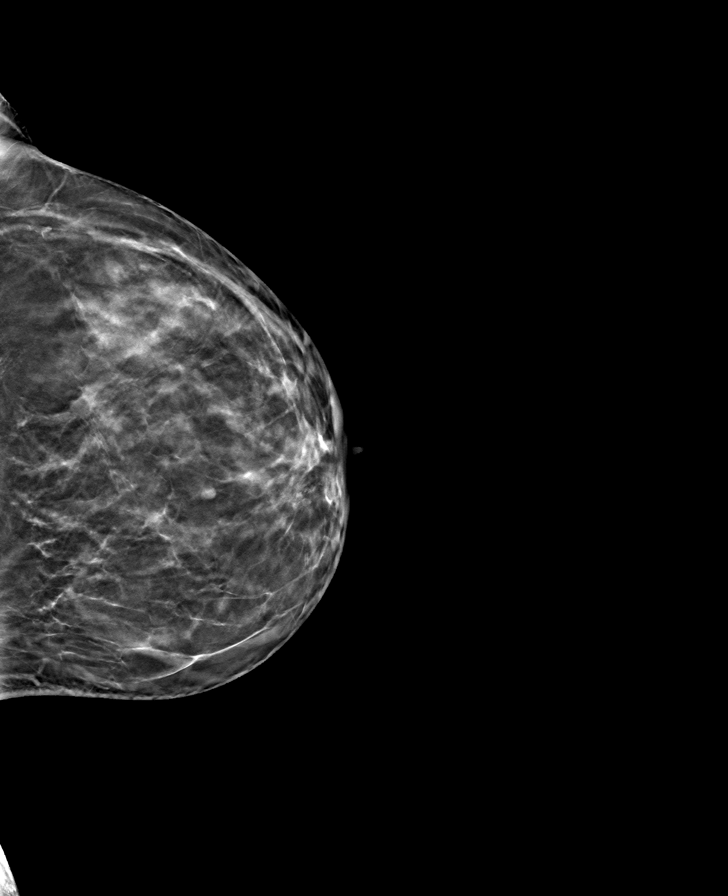

[R MLO tomo · tomo slice 37/72.0]
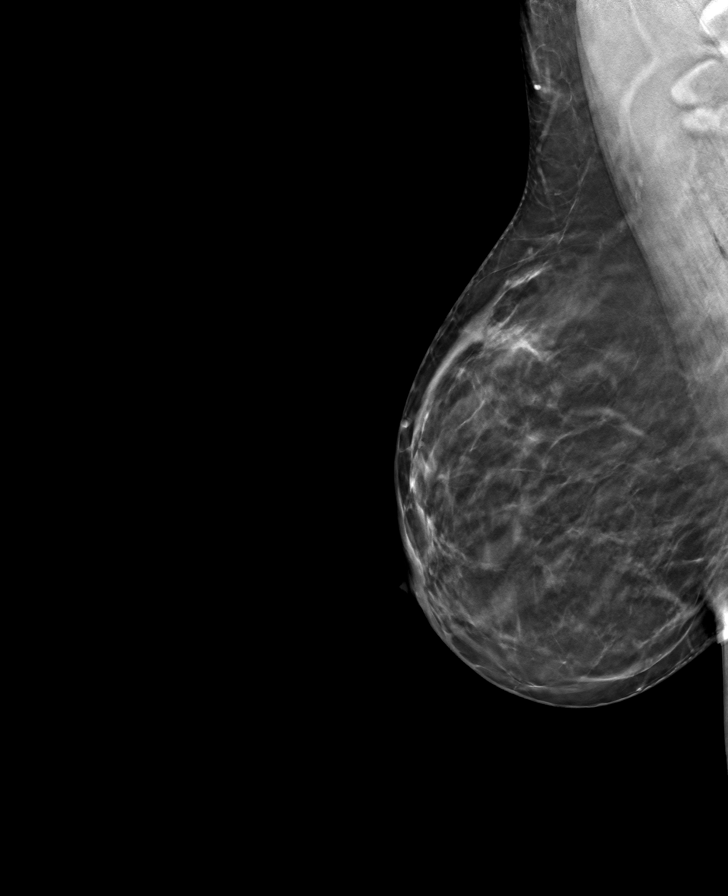

[8 of 24 positions shown; findings below may reference images not displayed]

ACR Breast Density Category b: There are scattered areas of
fibroglandular density.
FINDINGS: 2D/3D full field views of both breasts demonstrate no suspicious
findings within either breast.

Mammographic images were processed with CAD.

Targeted ultrasound is performed, showing a stable 0.9 x 0.3 x
cm circumscribed oval hypoechoic parallel area at the 1 o'clock
position of the LEFT breast 9 cm from the nipple, unchanged from
4544 and considered benign.
IMPRESSION: 1. Stable UPPER-OUTER LEFT breast mass/area, unchanged since 4544
and considered benign.
2. No mammographic evidence of breast malignancy.

RECOMMENDATION:
Bilateral screening mammogram in 1 year.

I have discussed the findings, causes and remedies of breast pain
and recommendations with the patient. If applicable, a reminder
letter will be sent to the patient regarding the next appointment.

BI-RADS CATEGORY  2: Benign.

## 2021-03-17 IMAGING — US US BREAST*L* LIMITED INC AXILLA
1 series · 6 of 6 positions shown · non-contrast
Comparison: Previous exam(s).

CLINICAL DATA: 39-year-old female with diffuse LEFT breast pain and
for delayed follow-up of LEFT breast mass.

EXAM:
DIGITAL DIAGNOSTIC BILATERAL MAMMOGRAM WITH CAD AND TOMO
ULTRASOUND LEFT BREAST

[Series 1: us breast*left* limited inc axilla · 0.06mm/px · 6 of 6 slices shown]
[im 1/6]
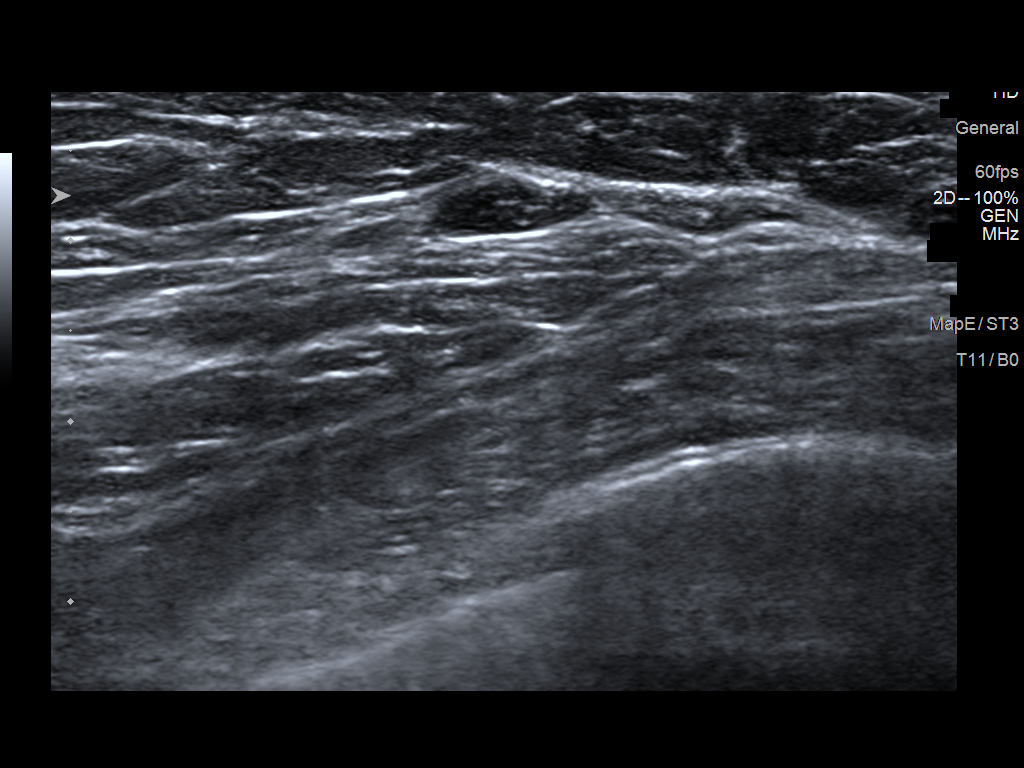
[im 2/6]
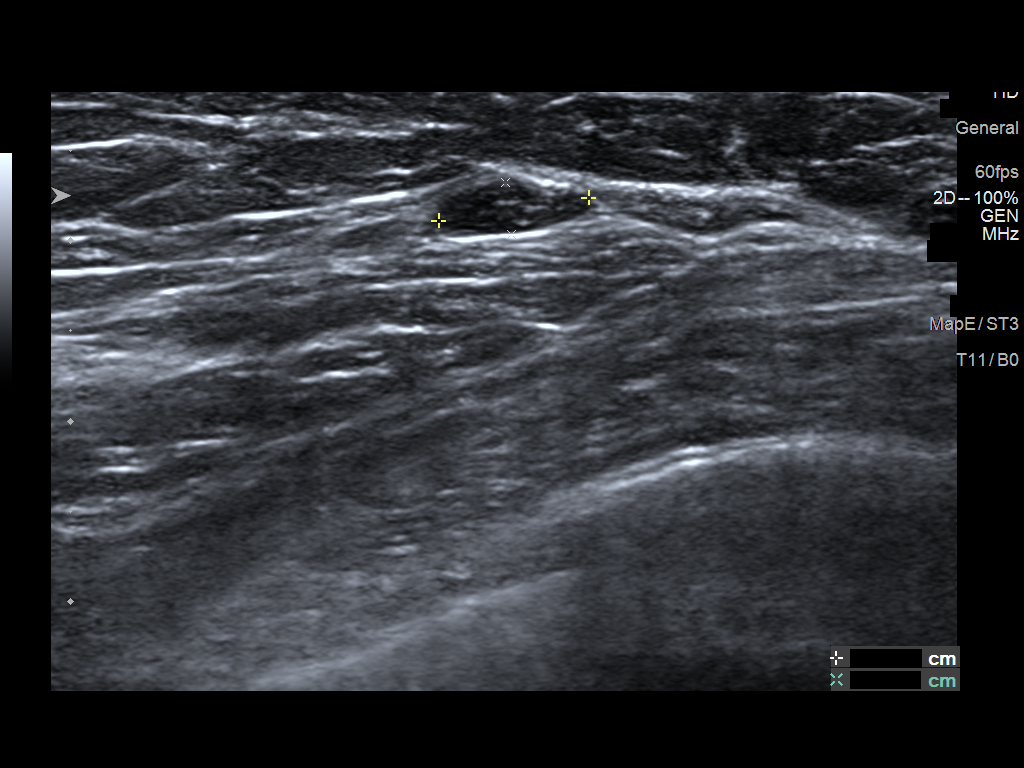
[im 3/6]
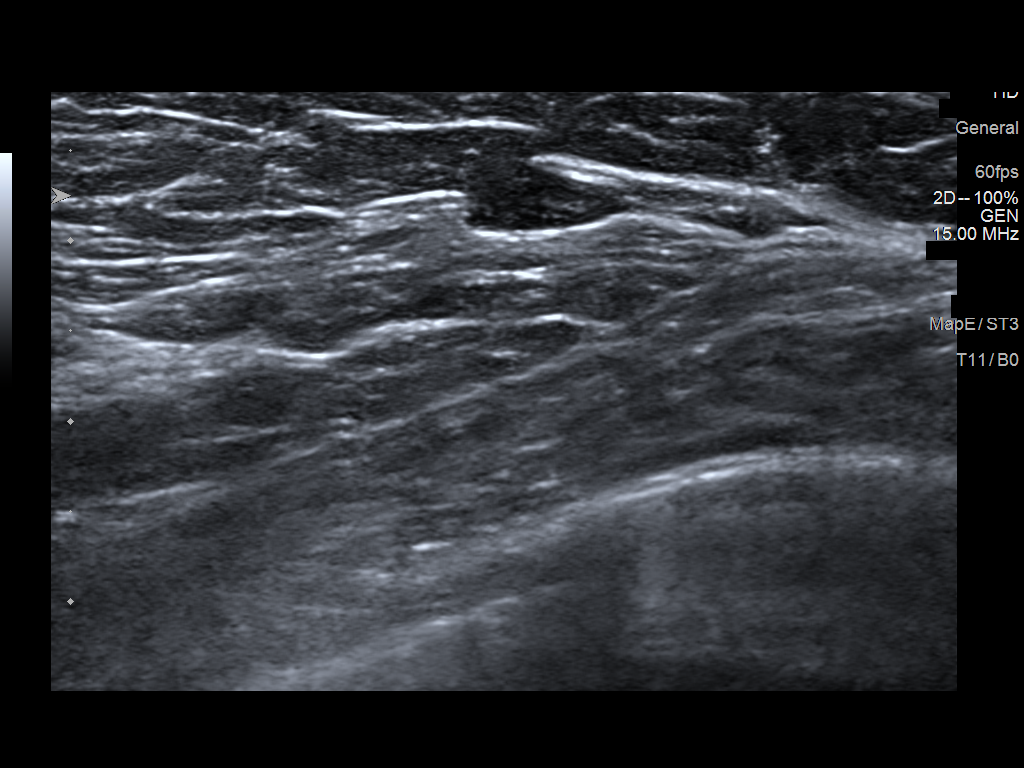
[im 4/6]
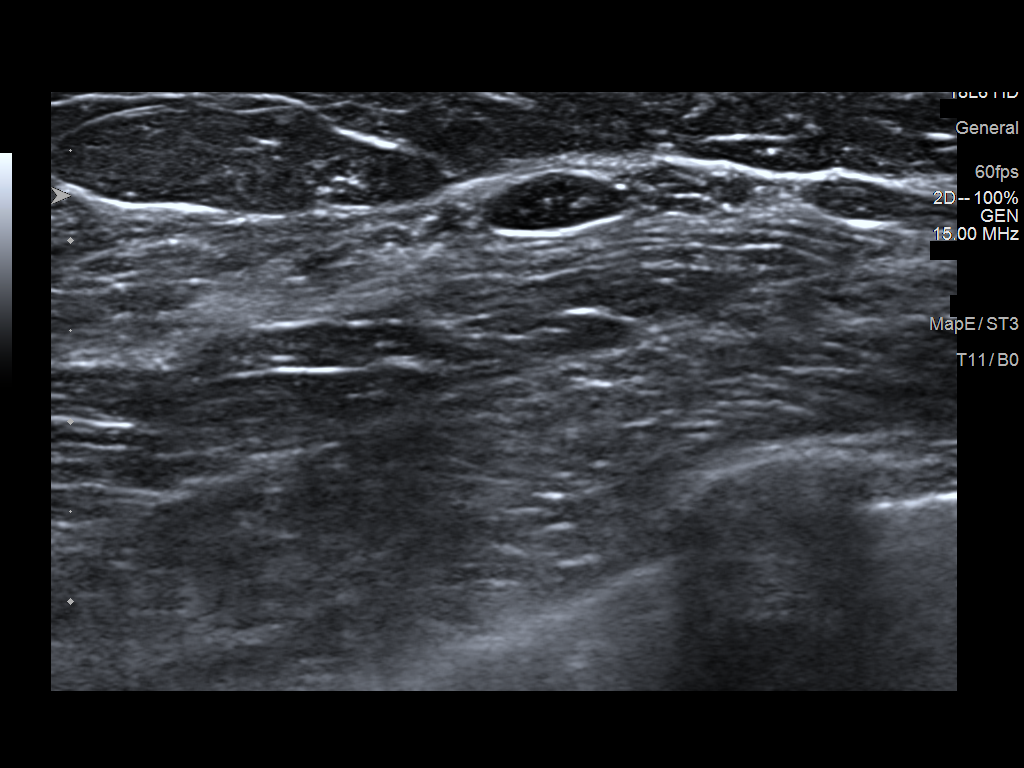
[im 5/6]
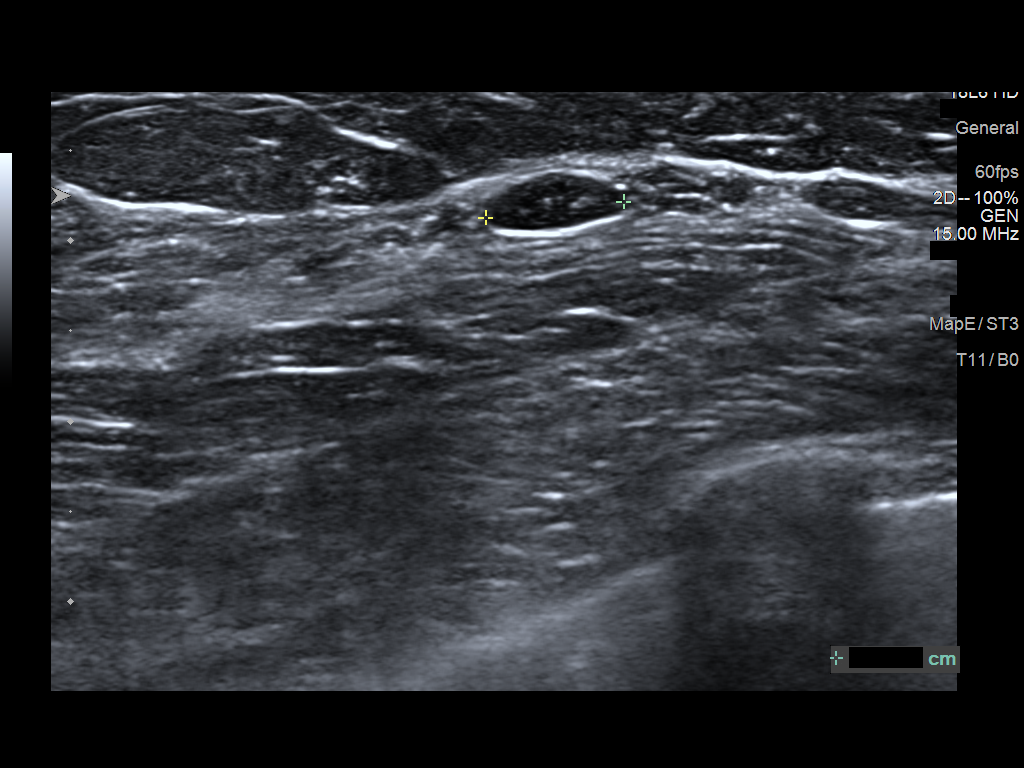
[im 6/6]
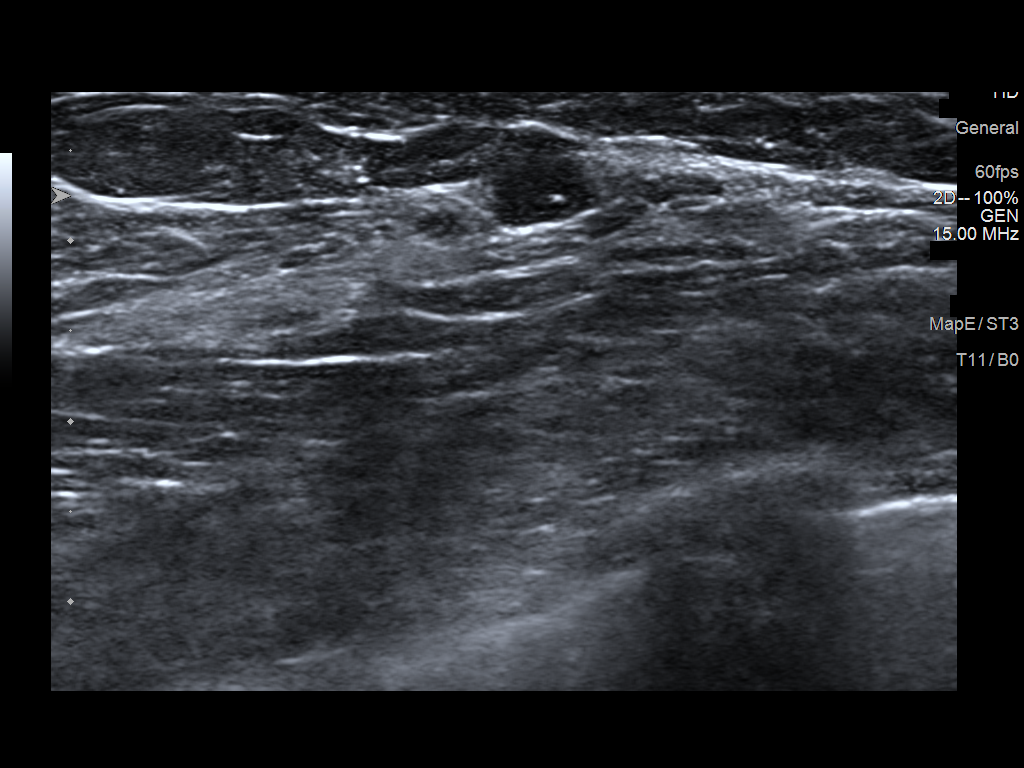

[6 of 6 positions shown; findings below may reference images not displayed]

ACR Breast Density Category b: There are scattered areas of
fibroglandular density.
FINDINGS: 2D/3D full field views of both breasts demonstrate no suspicious
findings within either breast.

Mammographic images were processed with CAD.

Targeted ultrasound is performed, showing a stable 0.9 x 0.3 x
cm circumscribed oval hypoechoic parallel area at the 1 o'clock
position of the LEFT breast 9 cm from the nipple, unchanged from
4544 and considered benign.
IMPRESSION: 1. Stable UPPER-OUTER LEFT breast mass/area, unchanged since 4544
and considered benign.
2. No mammographic evidence of breast malignancy.

RECOMMENDATION:
Bilateral screening mammogram in 1 year.

I have discussed the findings, causes and remedies of breast pain
and recommendations with the patient. If applicable, a reminder
letter will be sent to the patient regarding the next appointment.

BI-RADS CATEGORY  2: Benign.

## 2021-04-04 ENCOUNTER — Ambulatory Visit
Admission: EM | Admit: 2021-04-04 | Discharge: 2021-04-04 | Disposition: A | Discharge status: Home / Self Care | Payer: Medicaid Other | Attending: Physician Assistant | Admitting: Physician Assistant

## 2021-04-04 DIAGNOSIS — K649 Unspecified hemorrhoids: Secondary | ICD-10-CM | POA: Insufficient documentation

## 2021-04-04 DIAGNOSIS — R350 Frequency of micturition: Secondary | ICD-10-CM | POA: Insufficient documentation

## 2021-04-04 LAB — POCT URINALYSIS DIP (MANUAL ENTRY)
Bilirubin, UA: NEGATIVE
Glucose, UA: NEGATIVE mg/dL
Ketones, POC UA: NEGATIVE mg/dL
Leukocytes, UA: NEGATIVE
Nitrite, UA: NEGATIVE
Protein Ur, POC: NEGATIVE mg/dL
Spec Grav, UA: >=1.03 — AB (ref 1.010–1.025)
Urobilinogen, UA: 0.2 E.U./dL
pH, UA: 5.5 (ref 5.0–8.0)

## 2021-04-04 LAB — POCT URINE PREGNANCY: Preg Test, Ur: NEGATIVE

## 2021-04-04 MED ORDER — NITROFURANTOIN MONOHYD MACRO 100 MG PO CAPS: 100.0000 mg | ORAL_CAPSULE | Freq: Two times a day (BID) | ORAL | 0 refills | Status: DC

## 2021-04-04 MED ORDER — HYDROCORTISONE (PERIANAL) 2.5 % EX CREA: 1.0000 "application " | TOPICAL_CREAM | Freq: Two times a day (BID) | CUTANEOUS | 0 refills | Status: DC

## 2021-04-04 NOTE — ED Triage Notes (Signed)
Patient presents to Urgent Care with complaints of dysuria, increased urinary freq., and a hemorrhoid since yesterday. Pt states she has a hx of UTIs and constipation. Treating symptoms with AZO and preparation H.   Denies fever.
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
## 2021-04-04 NOTE — Discharge Instructions (Addendum)
Follow up with any further concerns.  

## 2021-04-05 LAB — URINE CULTURE: Culture: <10000 — AB

## 2021-04-23 ENCOUNTER — Ambulatory Visit
Admission: EM | Admit: 2021-04-23 | Discharge: 2021-04-23 | Disposition: A | Discharge status: Home / Self Care | Payer: Commercial Managed Care - PPO | Attending: Physician Assistant | Admitting: Physician Assistant

## 2021-04-23 ENCOUNTER — Encounter: Payer: Self-pay | Admitting: Emergency Medicine

## 2021-04-23 ENCOUNTER — Other Ambulatory Visit: Payer: Self-pay

## 2021-04-23 DIAGNOSIS — N760 Acute vaginitis: Secondary | ICD-10-CM

## 2021-04-23 MED ORDER — VALACYCLOVIR HCL 500 MG PO TABS: 500.0000 mg | ORAL_TABLET | Freq: Two times a day (BID) | ORAL | 0 refills | Status: AC

## 2021-04-23 NOTE — ED Triage Notes (Signed)
Patient states that she had sex a couple of days ago and has been having vaginal pain since.  Partner did wear a condom, doesn't see anything on the outside, pain is on the inside.

## 2021-04-24 ENCOUNTER — Encounter: Payer: Self-pay | Admitting: Physician Assistant

## 2021-04-24 ENCOUNTER — Ambulatory Visit: Payer: Self-pay
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
## 2021-04-27 LAB — CERVICOVAGINAL ANCILLARY ONLY
Bacterial Vaginitis (gardnerella): POSITIVE — AB
Candida Glabrata: NEGATIVE
Candida Vaginitis: POSITIVE — AB
Chlamydia: NEGATIVE
Comment: NEGATIVE
Comment: NEGATIVE
Comment: NEGATIVE
Comment: NEGATIVE
Comment: NEGATIVE
Comment: NORMAL
Neisseria Gonorrhea: NEGATIVE
Trichomonas: NEGATIVE

## 2021-04-28 ENCOUNTER — Encounter: Payer: Self-pay | Admitting: Nurse Practitioner

## 2021-04-28 ENCOUNTER — Telehealth (HOSPITAL_COMMUNITY): Payer: Self-pay | Admitting: Emergency Medicine

## 2021-04-28 MED ORDER — FLUCONAZOLE 150 MG PO TABS: 150.0000 mg | ORAL_TABLET | Freq: Once | ORAL | 0 refills | Status: AC

## 2021-04-28 MED ORDER — METRONIDAZOLE 500 MG PO TABS: 500.0000 mg | ORAL_TABLET | Freq: Two times a day (BID) | ORAL | 0 refills | Status: DC

## 2021-05-28 ENCOUNTER — Ambulatory Visit (INDEPENDENT_AMBULATORY_CARE_PROVIDER_SITE_OTHER): Payer: Commercial Managed Care - PPO | Admitting: Nurse Practitioner

## 2021-05-28 ENCOUNTER — Other Ambulatory Visit: Payer: Self-pay

## 2021-05-28 ENCOUNTER — Encounter: Payer: Self-pay | Admitting: Nurse Practitioner

## 2021-05-28 ENCOUNTER — Other Ambulatory Visit (HOSPITAL_COMMUNITY)
Admission: RE | Admit: 2021-05-28 | Discharge: 2021-05-28 | Disposition: A | Discharge status: Home / Self Care | Payer: Commercial Managed Care - PPO | Source: Ambulatory Visit | Attending: Nurse Practitioner | Admitting: Nurse Practitioner

## 2021-05-28 VITALS — BP 104/60 | HR 82 | Temp 96.9°F | Ht 61.0 in | Wt 149.8 lb

## 2021-05-28 DIAGNOSIS — N760 Acute vaginitis: Secondary | ICD-10-CM

## 2021-05-28 DIAGNOSIS — N765 Ulceration of vagina: Secondary | ICD-10-CM

## 2021-05-28 DIAGNOSIS — M1990 Unspecified osteoarthritis, unspecified site: Secondary | ICD-10-CM

## 2021-05-28 DIAGNOSIS — Z113 Encounter for screening for infections with a predominantly sexual mode of transmission: Secondary | ICD-10-CM

## 2021-05-28 DIAGNOSIS — B9689 Other specified bacterial agents as the cause of diseases classified elsewhere: Secondary | ICD-10-CM

## 2021-05-28 MED ORDER — VALACYCLOVIR HCL 1 G PO TABS: 1000.0000 mg | ORAL_TABLET | Freq: Two times a day (BID) | ORAL | 0 refills | Status: AC

## 2021-05-28 NOTE — Patient Instructions (Addendum)
Start valtrex. Only screening mammogram is recommended at this time. You will be contacted to schedule appt with [facility].

## 2021-05-28 NOTE — Progress Notes (Signed)
Subjective:  Patient ID: Lindsey Carlson, female    DOB: Oct 23, 1980  Age: 40 y.o. MRN: [id number]  CC: Acute Visit (Pt c/o vaginal pain on the right side x 2 weeks. Pt states she went to the ED 3 weeks ago and was given medication for BV and the pain stopped for a short time but is now back. She states she has tried changing some of the brand of her pads. Pt states she has noticed she has started to have discomfort during sex also. )  Vaginal Pain The patient's primary symptoms include genital lesions and vaginal discharge. The patient's pertinent negatives include no genital itching, genital odor, genital rash, missed menses, pelvic pain or vaginal bleeding. This is a recurrent problem. The current episode started 1 to 4 weeks ago. The problem occurs constantly. The problem has been waxing and waning. The pain is severe. The problem affects the right side. She is not pregnant. Associated symptoms include painful intercourse. Pertinent negatives include no abdominal pain, chills, diarrhea, dysuria, flank pain, frequency, rash, sore throat or urgency. The vaginal discharge was thin. The vaginal bleeding is typical of menses. She has not been passing clots. She has not been passing tissue. The symptoms are aggravated by intercourse and tactile pressure. Treatments tried: metronidazole. She is sexually active. It is unknown whether or not her partner has an STD. She uses tubal ligation for contraception. Her menstrual history has been regular. Her past medical history is significant for herpes simplex, an STD and vaginosis. There is no history of PID.   She is requesting for referral to [facility].  Reviewed past Medical, Social and Family history today.  Outpatient Medications Prior to Visit  Medication Sig Dispense Refill   hydrocortisone (ANUSOL-HC) 2.5 % rectal cream Place 1 application rectally 2 (two) times daily. 30 g 0   metroNIDAZOLE (FLAGYL) 500 MG tablet Take 1 tablet (500 mg  total) by mouth 2 (two) times daily. (Patient not taking: Reported on 05/28/2021) 14 tablet 0   nitrofurantoin, macrocrystal-monohydrate, (MACROBID) 100 MG capsule Take 1 capsule (100 mg total) by mouth 2 (two) times daily. (Patient not taking: Reported on 05/28/2021) 10 capsule 0   polyethylene glycol powder (GLYCOLAX/MIRALAX) 17 GM/SCOOP powder Take 17 g by mouth daily. (Patient not taking: Reported on 03/10/2021) 507 g 1   No facility-administered medications prior to visit.   ROS See HPI  Objective:  BP 104/60 (BP Location: Left Arm, Patient Position: Sitting, Cuff Size: Normal)   Pulse 82   Temp (!) 96.9 F (36.1 C) (Temporal)   Ht 5\' 1"  (1.549 m)   Wt 149 lb 12.8 oz (67.9 kg)   SpO2 98%   BMI 28.30 kg/m   Physical Exam Vitals reviewed. Exam conducted with a chaperone present.  Abdominal:     Hernia: There is no hernia in the left inguinal area or right inguinal area.  Genitourinary:    General: Normal vulva.     Exam position: Lithotomy position.     Labia:        Right: No rash, tenderness or lesion.        Left: No rash, tenderness or lesion.      Vagina: Vaginal discharge, tenderness and lesions present. No bleeding.     Cervix: Discharge present. No cervical motion tenderness, friability, lesion or erythema.     Adnexa: Right adnexa normal and left adnexa normal.  Lymphadenopathy:     Lower Body: Right inguinal adenopathy present. No left inguinal adenopathy.  Assessment & Plan:  This visit occurred during the SARS-CoV-2 public health emergency.  Safety protocols were in place, including screening questions prior to the visit, additional usage of staff PPE, and extensive cleaning of exam room while observing appropriate contact time as indicated for disinfecting solutions.   Lindsey Carlson was seen today for acute visit.  Diagnoses and all orders for this visit:  Vaginal ulcer -     valACYclovir (VALTREX) 1000 MG tablet; Take 1 tablet (1,000 mg total) by mouth 2 (two)  times daily for 10 days. -     Cancel: Herpes simplex virus culture -     Cancel: Herpes simplex virus culture -     Herpes simplex virus culture  Screen for STD (sexually transmitted disease) -     Cervicovaginal ancillary only  Inflammatory arthritis -     Ambulatory referral to Rheumatology   Problem List Items Addressed This Visit       Musculoskeletal and Integument   Inflammatory arthritis   Relevant Orders   Ambulatory referral to Rheumatology   Other Visit Diagnoses     Vaginal ulcer    -  Primary   Relevant Medications   valACYclovir (VALTREX) 1000 MG tablet   Other Relevant Orders   Herpes simplex virus culture   Screen for STD (sexually transmitted disease)       Relevant Orders   Cervicovaginal ancillary only       Follow-up: No follow-ups on file.  Alysia Penna, NP

## 2021-05-29 ENCOUNTER — Encounter: Payer: Self-pay | Admitting: Nurse Practitioner

## 2021-05-29 LAB — CERVICOVAGINAL ANCILLARY ONLY
Bacterial Vaginitis (gardnerella): POSITIVE — AB
Candida Glabrata: NEGATIVE
Candida Vaginitis: NEGATIVE
Chlamydia: NEGATIVE
Comment: NEGATIVE
Comment: NEGATIVE
Comment: NEGATIVE
Comment: NEGATIVE
Comment: NEGATIVE
Comment: NORMAL
Neisseria Gonorrhea: NEGATIVE
Trichomonas: NEGATIVE

## 2021-05-31 MED ORDER — METRONIDAZOLE 500 MG PO TABS: 500.0000 mg | ORAL_TABLET | Freq: Two times a day (BID) | ORAL | 0 refills | Status: AC

## 2021-05-31 NOTE — Addendum Note (Signed)
Addended by: Michaela Corner on: 05/31/2021 06:42 PM   Modules accepted: Orders

## 2021-06-02 LAB — HERPES SIMPLEX VIRUS CULTURE
MICRO NUMBER:: 12732037
SPECIMEN QUALITY:: ADEQUATE

## 2021-06-08 ENCOUNTER — Ambulatory Visit: Payer: Medicaid Other | Admitting: Nurse Practitioner

## 2021-06-16 ENCOUNTER — Encounter: Payer: Self-pay | Admitting: Obstetrics and Gynecology

## 2021-06-16 ENCOUNTER — Other Ambulatory Visit: Payer: Self-pay

## 2021-06-16 ENCOUNTER — Ambulatory Visit (INDEPENDENT_AMBULATORY_CARE_PROVIDER_SITE_OTHER): Payer: Commercial Managed Care - PPO | Admitting: Obstetrics and Gynecology

## 2021-06-16 VITALS — BP 125/80 | HR 79 | Wt 146.0 lb

## 2021-06-16 DIAGNOSIS — K5900 Constipation, unspecified: Secondary | ICD-10-CM | POA: Diagnosis not present

## 2021-06-16 DIAGNOSIS — N3946 Mixed incontinence: Secondary | ICD-10-CM | POA: Diagnosis not present

## 2021-06-16 DIAGNOSIS — N920 Excessive and frequent menstruation with regular cycle: Secondary | ICD-10-CM

## 2021-06-16 DIAGNOSIS — N941 Unspecified dyspareunia: Secondary | ICD-10-CM

## 2021-06-16 DIAGNOSIS — N946 Dysmenorrhea, unspecified: Secondary | ICD-10-CM

## 2021-06-16 LAB — POCT URINALYSIS DIPSTICK
Bilirubin, UA: NEGATIVE
Blood, UA: NEGATIVE
Glucose, UA: NEGATIVE
Ketones, UA: NEGATIVE
Leukocytes, UA: NEGATIVE
Nitrite, UA: NEGATIVE
Protein, UA: NEGATIVE
Spec Grav, UA: 1.025 (ref 1.010–1.025)
Urobilinogen, UA: 0.2 E.U./dL
pH, UA: 6 (ref 5.0–8.0)
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
## 2021-06-16 NOTE — Progress Notes (Signed)
Patient presents for a problem visit today.   LMP:05/24/21-05/27/21 Last pap: 03/10/21 Mammo: 06/10/20  CC: Pain during intercourse x 2 month ago. Pain is 10/10x after intercourse.  Pt PCP thought pt have had a HSV outbreak. Pt also states PCP told her she had a vaginal infection.  Results are in Mychart pt is NEG for HSV had BV on 05/28/21 told if sx's continue to F/U with GYN.

## 2021-06-23 ENCOUNTER — Encounter: Payer: Self-pay | Admitting: Nurse Practitioner

## 2021-07-01 ENCOUNTER — Encounter (HOSPITAL_COMMUNITY): Payer: Self-pay

## 2021-07-01 ENCOUNTER — Ambulatory Visit (HOSPITAL_COMMUNITY): Admission: RE | Admit: 2021-07-01 | Payer: Medicaid Other | Source: Ambulatory Visit

## 2021-07-15 ENCOUNTER — Other Ambulatory Visit: Payer: Self-pay | Admitting: Nurse Practitioner

## 2021-07-15 DIAGNOSIS — Z1231 Encounter for screening mammogram for malignant neoplasm of breast: Secondary | ICD-10-CM

## 2021-07-16 ENCOUNTER — Ambulatory Visit: Payer: Medicaid Other | Admitting: Physical Therapy

## 2021-07-20 ENCOUNTER — Other Ambulatory Visit: Payer: Self-pay

## 2021-07-20 ENCOUNTER — Ambulatory Visit
Admission: RE | Admit: 2021-07-20 | Discharge: 2021-07-20 | Disposition: A | Discharge status: Home / Self Care | Payer: Commercial Managed Care - PPO | Source: Ambulatory Visit | Attending: Obstetrics and Gynecology | Admitting: Obstetrics and Gynecology

## 2021-07-20 DIAGNOSIS — N941 Unspecified dyspareunia: Secondary | ICD-10-CM | POA: Insufficient documentation

## 2021-07-22 ENCOUNTER — Ambulatory Visit: Payer: Medicaid Other | Admitting: Physical Therapy

## 2021-07-28 ENCOUNTER — Ambulatory Visit (INDEPENDENT_AMBULATORY_CARE_PROVIDER_SITE_OTHER): Payer: Commercial Managed Care - PPO | Admitting: Obstetrics and Gynecology

## 2021-07-28 ENCOUNTER — Encounter: Payer: Self-pay | Admitting: Obstetrics and Gynecology

## 2021-07-28 ENCOUNTER — Other Ambulatory Visit: Payer: Self-pay

## 2021-07-28 VITALS — BP 122/80 | Ht 61.0 in | Wt 143.0 lb

## 2021-07-28 DIAGNOSIS — N946 Dysmenorrhea, unspecified: Secondary | ICD-10-CM | POA: Diagnosis not present

## 2021-07-28 DIAGNOSIS — N941 Unspecified dyspareunia: Secondary | ICD-10-CM

## 2021-07-28 DIAGNOSIS — Z862 Personal history of diseases of the blood and blood-forming organs and certain disorders involving the immune mechanism: Secondary | ICD-10-CM | POA: Diagnosis not present

## 2021-07-28 DIAGNOSIS — N920 Excessive and frequent menstruation with regular cycle: Secondary | ICD-10-CM

## 2021-07-28 NOTE — Progress Notes (Signed)
Obstetrics and Gynecology Visit Return Patient Evaluation  Appointment Date: 07/28/2021  Primary Care Provider: [name], [place] for [facility]  Chief Complaint: follow up ultrasound  History of Present Illness:  Lindsey Carlson is a 41 y.o. G3P3 (LMP 07/20/2021) with a history of vaginal deliveries x 3, lupus, and rheumatoid arthritis who presents for follow-up to discuss results of an ultrasound ordered for complaints of dyspareunia.   Patient doing well. On 1/30, she endorsed pain at the entrance of her vagina on the right side which had been going on for approx 3 months, but she has not had pain since her ultrasound on that date. She said this pain was related to intercourse, and she has not had any intercourse because she wants to avoid the pain. She was told she had an infection at a prior appointment but currently denies irritation, erythema, and itching.  Ultrasound on 1/30 showed free fluid and right hemorrhagic cyst measuring approx 2cm (see below).   She reports her most recent menstrual period started the day of the ultrasound. 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 Since then, she has had extremely heavy periods, and she says her monthly periods last ~5 days. She uses 2 packs of 48 overnight pads each period. She reports her daughter and other family members have similar heavy bleeding. The patient is being proactive about this pain because she has a family history of aunts and cousins with GYN cancers and diagnoses. 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  She has been exercising and attempting to lose weight. She is not currently being treated for SLE or RA.   Review of Systems: as noted in the History of Present Illness.  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 Current Outpatient Medications  Medication Sig Dispense Refill   hydrocortisone (ANUSOL-HC) 2.5 % rectal cream Place 1 application rectally 2 (two) times daily. 30 g 0   No current facility-administered medications for this visit.    Allergies: has No Known Allergies.  Physical Exam:  BP 122/80    Ht _0  (1.549 m)    Wt 143 lb (64.9 kg)    LMP 07/23/2021    BMI 27.02 kg/m  Body mass index is 27.02 kg/m. General appearance: Well nourished, well developed female in no acute distress.  Neuro/Psych:  Normal mood and affect.     Radiology: Narrative & Impression  CLINICAL DATA:  Dyspareunia   EXAM: TRANSABDOMINAL AND TRANSVAGINAL ULTRASOUND OF PELVIS   DOPPLER ULTRASOUND OF OVARIES   TECHNIQUE: Both transabdominal and transvaginal ultrasound examinations of the pelvis were performed. Transabdominal technique was performed for global imaging of the pelvis including uterus, ovaries, adnexal regions, and pelvic cul-de-sac.   It was necessary to proceed with endovaginal exam following the transabdominal exam to visualize the ovaries. Color and duplex Doppler ultrasound was utilized to evaluate blood flow to the ovaries.   COMPARISON:  CT done on 06/24/2020   FINDINGS: Uterus   Measurements: 7.9 x 5.9 x 6  cm = volume: 150 mL. There is inhomogeneous echogenicity in myometrium. There are possible tiny cystic foci in the body of the uterus. There is 1.9 x 1.4 cm fibroid in the fundus.   Endometrium   Thickness: 10 mm.  No focal abnormality visualized.   Right ovary   Measurements: 2 x 1.1 x 1 cm = volume: 1.2 mL. There is 2 cm complex cyst in the  right adnexa suggesting hemorrhagic cyst/follicle.   Left ovary   Measurements: 3.6 x 1.9 x 1.7 cm = volume: 6.3 mL. Normal appearance/no adnexal mass.   Pulsed Doppler evaluation of both ovaries demonstrates normal low-resistance arterial and venous waveforms.   Other findings   Small amount of free fluid is seen in the pelvis. Technologist elicited pain in the posterior cul-de-sac during the examination.   IMPRESSION: Small amount of free fluid in the pelvis may be due to recent rupture of ovarian cyst or follicle. There is 2 cm complex cyst in the right adnexa suggesting hemorrhagic cyst or follicle.   There is inhomogeneous echogenicity in myometrium with 1.9 cm fibroid in the fundus.     Electronically Signed   By: Elmer Picker M.D.   On: 07/20/2021 14:06   Assessment: pt stable  Plan:  1. History of lupus anticoagulant disorder - Antiphospholipid syndrome eval, bld  2. Menorrhagia with regular cycle - CBC - US PELVIS TRANSVAGINAL NON-OB (TV ONLY); Future  3. Dyspareunia in female I told her that her s/s could be related to the right sided ovarian cyst, but I recommend rpt u/s. U/s images reviewed and it does have a mottled appearance that looks like she could have some adenomyosis.  I d/w her re: hormone suppression to prevent new cysts from appearing and she is amenable to this; she would not like to do depo provera. I also told her that this could help with any potential adenomyosis.   Will check lupus labs and if negative then patient can do OCPs, which will see about doing them continuously. Pt told that it's fine to resume sexual intercourse if she feels able to.   Follow up pelvic floor PT appt on 2/14.  - US PELVIS TRANSVAGINAL NON-OB (TV ONLY); Future  4. Dysmenorrhea  RTC: in 2-3 months after u/s.   Durene Romans MD Attending Center for [employer])

## 2021-07-31 ENCOUNTER — Encounter: Payer: Self-pay | Admitting: Nurse Practitioner

## 2021-07-31 LAB — CBC
Hematocrit: 38.7 % (ref 34.0–46.6)
Hemoglobin: 12.3 g/dL (ref 11.1–15.9)
MCH: 26.3 pg — ABNORMAL LOW (ref 26.6–33.0)
MCHC: 31.8 g/dL (ref 31.5–35.7)
MCV: 83 fL (ref 79–97)
Platelets: 317 10*3/uL (ref 150–450)
RBC: 4.67 x10E6/uL (ref 3.77–5.28)
RDW: 14.8 % (ref 11.7–15.4)
WBC: 7.9 10*3/uL (ref 3.4–10.8)

## 2021-07-31 LAB — ANTIPHOSPHOLIPID SYNDROME EVAL, BLD
APTT PPP: 27.6 s (ref 22.9–30.2)
Anticardiolipin IgG: <9 GPL U/mL (ref 0–14)
Anticardiolipin IgM: 23 MPL U/mL — ABNORMAL HIGH (ref 0–12)
Beta-2 Glyco 1 IgM: <9 GPI IgM units (ref 0–32)
Beta-2 Glyco I IgG: <9 GPI IgG units (ref 0–20)
Dilute Viper Venom Time: 32.7 s (ref 0.0–47.0)
Hexagonal Phase Phospholipid: 1 s (ref 0–11)
INR: 1 (ref 0.9–1.2)
PT: 10.6 s (ref 9.1–12.0)
Thrombin Time: 17.7 s (ref 0.0–23.0)

## 2021-07-31 LAB — COAG STUDIES INTERP REPORT

## 2021-08-03 ENCOUNTER — Other Ambulatory Visit: Payer: Self-pay

## 2021-08-03 ENCOUNTER — Ambulatory Visit
Admission: RE | Admit: 2021-08-03 | Discharge: 2021-08-03 | Disposition: A | Discharge status: Home / Self Care | Payer: Commercial Managed Care - PPO | Source: Ambulatory Visit | Attending: Nurse Practitioner | Admitting: Nurse Practitioner

## 2021-08-03 DIAGNOSIS — Z1231 Encounter for screening mammogram for malignant neoplasm of breast: Secondary | ICD-10-CM

## 2021-08-03 NOTE — Telephone Encounter (Signed)
Pt would like to see someone about this, I advised her to contact her GYN since they are the ones that did the labs. She does want to know if she can either do a video visit or in person visit with someone here to discuss, Is this ok?

## 2021-08-04 ENCOUNTER — Encounter: Payer: Self-pay | Admitting: Physical Therapy

## 2021-08-04 ENCOUNTER — Ambulatory Visit: Payer: Commercial Managed Care - PPO | Attending: Obstetrics and Gynecology | Admitting: Physical Therapy

## 2021-08-04 DIAGNOSIS — M62838 Other muscle spasm: Secondary | ICD-10-CM | POA: Insufficient documentation

## 2021-08-04 DIAGNOSIS — N941 Unspecified dyspareunia: Secondary | ICD-10-CM | POA: Insufficient documentation

## 2021-08-04 DIAGNOSIS — R293 Abnormal posture: Secondary | ICD-10-CM | POA: Diagnosis not present

## 2021-08-04 DIAGNOSIS — N3946 Mixed incontinence: Secondary | ICD-10-CM | POA: Insufficient documentation

## 2021-08-04 DIAGNOSIS — M6281 Muscle weakness (generalized): Secondary | ICD-10-CM | POA: Insufficient documentation

## 2021-08-04 DIAGNOSIS — R279 Unspecified lack of coordination: Secondary | ICD-10-CM | POA: Diagnosis not present
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
## 2021-08-04 NOTE — Patient Instructions (Signed)

## 2021-08-06 ENCOUNTER — Telehealth: Payer: Self-pay | Admitting: Obstetrics and Gynecology

## 2021-08-06 NOTE — Telephone Encounter (Signed)
GYN Telephone Note  Patient called and results d/w her. She said she clarified with her rheumatologist and she has RA and not lupus. I told her I will do more research with her having RA but I doubt she can be on any estrogen methods due to history and mildly elevated anti-cardiolipin antibodies. I told her it's only mild elevated so her risk of blood clots is still extremely low, as if she didn't have the antibodies, but you don't want to add any medications that would increase her blood clotting risk.  Will contact patient later  Durene Romans MD [facility] ([facility]ulty Practice) 08/06/2021 Time: 1141am

## 2021-08-17 ENCOUNTER — Ambulatory Visit: Payer: Commercial Managed Care - PPO | Admitting: Physical Therapy

## 2021-08-19 ENCOUNTER — Ambulatory Visit: Payer: Commercial Managed Care - PPO | Admitting: Nurse Practitioner

## 2021-08-19 ENCOUNTER — Encounter: Payer: Self-pay | Admitting: Obstetrics and Gynecology

## 2021-08-26 ENCOUNTER — Encounter: Payer: Commercial Managed Care - PPO | Admitting: Physical Therapy

## 2021-09-01 ENCOUNTER — Ambulatory Visit: Payer: Commercial Managed Care - PPO | Admitting: Nurse Practitioner

## 2021-09-02 ENCOUNTER — Ambulatory Visit: Payer: Commercial Managed Care - PPO | Attending: Obstetrics and Gynecology | Admitting: Physical Therapy

## 2021-09-02 ENCOUNTER — Other Ambulatory Visit: Payer: Self-pay

## 2021-09-02 DIAGNOSIS — M6281 Muscle weakness (generalized): Secondary | ICD-10-CM | POA: Insufficient documentation

## 2021-09-02 DIAGNOSIS — M62838 Other muscle spasm: Secondary | ICD-10-CM | POA: Insufficient documentation

## 2021-09-02 DIAGNOSIS — R293 Abnormal posture: Secondary | ICD-10-CM | POA: Insufficient documentation

## 2021-09-02 DIAGNOSIS — R279 Unspecified lack of coordination: Secondary | ICD-10-CM | POA: Insufficient documentation

## 2021-09-02 NOTE — Patient Instructions (Signed)
[email or web address] ? ? ? ?Moisturizers ?They are used in the vagina to hydrate the mucous membrane that make up the vaginal canal. ?Designed to keep a more normal acid balance (ph) ?Once placed in the vagina, it will last between two to three days.  ?Use 2-3 times per week at bedtime  ?Ingredients to avoid is glycerin and fragrance, can increase chance of infection ?Should not be used just before sex due to causing irritation ?Most are gels administered either in a tampon-shaped applicator or as a vaginal suppository. They are non-hormonal. ? ? ?Types of Moisturizers(internal use) ? ?Vitamin E vaginal suppositories- Whole foods, Amazon ?Moist Again ?Coconut oil- can break down condoms ?Julva- (Do no use if on Tamoxifen) amazon ?Yes moisturizer- amazon ?NeuEve Silk , NeuEve Silver for menopausal or over 65 (if have severe vaginal atrophy or cancer treatments use NeuEve Silk for  1 month than move to [employer])- [employer], [place].com ?Olive and Bee intimate cream- www.oliveandbee.com.au ?Mae vaginal moisturizer- Amazon ?Aloe ? ? ? ?Creams to use externally on the Vulva area ?Desert [employer] (good for for cancer patients that had radiation to the area)- [place] or [employer].[email or web address]://garcia-[email or web address] ?V-magic cream - amazon ?Julva-amazon ?Vital "V Wild Yam salve ( help moisturize and help with thinning vulvar area, does have Beeswax ?MoodMaid Botanical Pro-Meno Wild Yam Cream- Amazon ?Desert [employer] Corporation ?Cleo by Damiva labial moisturizer (Amazon,  ?Coconut or olive oil ?aloe ? ? ?Things to avoid in the vaginal area ?Do not use things to irritate the vulvar area ?No lotions just specialized creams for the vulva area- Neogyn, V-magic, No soaps; can use Aveeno or Calendula cleanser if needed. Must be gentle ?No deodorants ?No douches ?Good to sleep without underwear to let the vaginal area to air out ?No scrubbing: spread the lips to let warm water rinse over labias and pat dry ? ? ? ?Lubrication ?Used  for intercourse to reduce friction ?Avoid ones that have glycerin, nonoxynol-9, petroleum, propylene glycol, chlorhexidine gluconate, warming gels, tingling gels, icing or cooling gel, scented ?Avoid parabens due to a preservative similar to female sex hormone ?May need to be reapplied once or several times during sexual activity ?Can be applied to both partners genitals prior to vaginal penetration to minimize friction or irritation ?Prevent irritation and mucosal tears that cause post coital pain and increased the risk of vaginal and urinary tract infections ?Oil-based lubricants cannot be used with condoms due to breaking them down.  Least likely to irritate vaginal tissue.  ?Plant based-lubes are safe ?Silicone-based lubrication are thicker and last long and used for post-menopausal women ? ?Vaginal Lubricators ?Here is a list of some suggested lubricators you can use for intercourse. Use the most hypoallergenic product.  You can place on you or your partner.  ?Slippery Stuff ( water based) ?Sylk or Sliquid Natural H2O ( good  if frequent UTI?s)- walmart, amazon ?Sliquid organics silk-(aloe and silicone based ) ?Blossom Organics (www.blossom-organics.com)- (aloe based ) ?Coconut oil, olive oil -not good with condoms  ?PJur Woman Nude- (water based) amazon ?Uberlube- ( silicon) Amazon ?Aloe Vera- Sprouts has an organic one ?Yes lubricant- (water based and has plant oil based similar to silicone) Amazon ?Wet Platinum-Silicone, Target, Walgreens ?Olive and Bee intimate cream-  www.oliveandbee.com.au ?Pink - Amazon ?Wet stuff ?Erosense Sync- walmart, amazon ?Coconu- EverydayCosmetics.no ? ?Things to avoid in lubricants are glycerin, warming gels, tingling gels, icing or cooling ? gels, and scented gels.  Also avoid Vaseline. ?KY jelly, Replens, and Astroglide contain chlorhexidine which kills good bacteria(lactobacilli) ? ?  Things to avoid in the vaginal area ?Do not use things to irritate the vulvar area ?No lotions- see  below ?Soaps you  can use :Aveeno, Calendula, Good Clean Love cleanser if needed. Must be gentle ?No deodorants ?No douches ?Good to sleep without underwear to let the vaginal area to air out ?No scrubbing: spread the lips to let warm water rinse over labias and pat dry ? ?Creams that can be used on the Vulva Area ?V magic-amazon, walmart ?Vital V [employer] CorporationWild Yam Salve ?Julva- Amazon ?MoonMaid Botanical Pro-Meno Wild Yam Cream ?Coconut oil, olive oil ?Cleo by Damiva labial moisturizer -Amazon,  ?Desert Havest Releveum ( lidocaine) or Desert Fluor CorporationHarvest Gele ?Yes Moisturizer ? ?   ?
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
## 2021-09-03 ENCOUNTER — Ambulatory Visit (INDEPENDENT_AMBULATORY_CARE_PROVIDER_SITE_OTHER): Payer: Commercial Managed Care - PPO | Admitting: Nurse Practitioner

## 2021-09-03 ENCOUNTER — Encounter: Payer: Self-pay | Admitting: Obstetrics and Gynecology

## 2021-09-03 ENCOUNTER — Ambulatory Visit (INDEPENDENT_AMBULATORY_CARE_PROVIDER_SITE_OTHER): Payer: Commercial Managed Care - PPO | Admitting: Obstetrics and Gynecology

## 2021-09-03 ENCOUNTER — Encounter: Payer: Self-pay | Admitting: Nurse Practitioner

## 2021-09-03 VITALS — BP 100/60 | HR 72 | Temp 98.3°F | Ht 61.0 in | Wt 141.0 lb

## 2021-09-03 VITALS — BP 116/77 | HR 71 | Ht 61.0 in | Wt 139.8 lb

## 2021-09-03 DIAGNOSIS — N920 Excessive and frequent menstruation with regular cycle: Secondary | ICD-10-CM | POA: Diagnosis not present

## 2021-09-03 DIAGNOSIS — N946 Dysmenorrhea, unspecified: Secondary | ICD-10-CM | POA: Diagnosis not present

## 2021-09-03 DIAGNOSIS — M1990 Unspecified osteoarthritis, unspecified site: Secondary | ICD-10-CM | POA: Diagnosis not present

## 2021-09-03 DIAGNOSIS — N941 Unspecified dyspareunia: Secondary | ICD-10-CM | POA: Diagnosis not present

## 2021-09-03 DIAGNOSIS — E7800 Pure hypercholesterolemia, unspecified: Secondary | ICD-10-CM | POA: Diagnosis not present

## 2021-09-03 DIAGNOSIS — N83201 Unspecified ovarian cyst, right side: Secondary | ICD-10-CM | POA: Diagnosis not present

## 2021-09-03 MED ORDER — MELOXICAM 15 MG PO TABS: 15.0000 mg | ORAL_TABLET | Freq: Every day | ORAL | 0 refills | Status: DC

## 2021-09-03 NOTE — Progress Notes (Signed)
Formatting of this note is different from the original.  Images from the original note were not included.                   Established Patient Visit    Patient: Brandi Cox   DOB: 1981-05-08   41 y.o. Female  MRN: [id number]  Visit Date: 09/03/2021    Subjective:     Chief Complaint   Patient presents with    Acute Visit     Pt would like to discuss possible referral to Rheumatology.       HPI  Inflammatory arthritis  Chronic, worsening joint pain and swelling x 66month. No OTC used. Worse with activity. AM stiffness >341ms.  FHx of lupus and RA (aunt and cousins).  Unable to afford appt with [facility]-Rheumatology.  Antiphospholipid done 07/2021: mild elevation    Check ESR, CRP, ANA, and RH-factor  Entered referral to [facility]-rheumatology  Start meloxicam daily    Elevated LDL cholesterol level  Reports change in diet to low fat and low carb. She has also maintain daily exercise regimen 3-5x/week.  Wt Readings from Last 3 Encounters:   09/03/21 141 lb (64 kg)   09/03/21 139 lb 12.8 oz (63.4 kg)   07/28/21 143 lb (64.9 kg)     Repeat lipid panel        Medications:  No outpatient medications prior to visit.     No facility-administered medications prior to visit.     Reviewed past medical and social history.   Review of Systems Per HPI      Objective:     BP 100/60 (BP Location: Left Arm, Patient Position: Sitting, Cuff Size: Normal)   Pulse 72   Temp 98.3 F (36.8 C) (Temporal)   Ht 5' 1"  (1.549 m)   Wt 141 lb (64 kg)   SpO2 98%   BMI 26.64 kg/m     Physical Exam  Vitals reviewed.   Cardiovascular:      Rate and Rhythm: Normal rate.      Pulses: Normal pulses.   Pulmonary:      Effort: Pulmonary effort is normal.   Musculoskeletal:         General: No swelling or deformity.      Right lower leg: No edema.      Left lower leg: No edema.   Skin:     Findings: No erythema or rash.   Neurological:      Mental Status: She is oriented to person, place, and time.     No results found for any visits on  09/03/21.    Assessment & Plan:     Problem List Items Addressed This Visit         Musculoskeletal and Integument    Inflammatory arthritis - Primary     Chronic, worsening joint pain and swelling x 90m14monthNo OTC used. Worse with activity. AM stiffness >7m45m  FHx of lupus and RA (aunt and cousins).  Unable to afford appt with [facility].  Antiphospholipid done 07/2021: mild elevation    Check ESR, CRP, ANA, and RH-factor  Entered referral to [facility]-rheumatology  Start meloxicam daily       Relevant Medications    meloxicam (MOBIC) 15 MG tablet    Other Relevant Orders    Ambulatory referral to Rheumatology    C-reactive protein    Sedimentation rate    Rheumatoid Factor    ANA w/Reflex      Other  Elevated LDL cholesterol level     Reports change in diet to low fat and low carb. She has also maintain daily exercise regimen 3-5x/week.  Wt Readings from Last 3 Encounters:   09/03/21 141 lb (64 kg)   09/03/21 139 lb 12.8 oz (63.4 kg)   07/28/21 143 lb (64.9 kg)     Repeat lipid panel       Relevant Orders    Lipid panel     Return if symptoms worsen or fail to improve.        Wilfred Lacy, NP      Electronically signed by Flossie Buffy, NP at 09/03/2021  3:13 PM EDT

## 2021-09-03 NOTE — Assessment & Plan Note (Signed)
Associated Problem(s): Inflammatory arthritis  Formatting of this note might be different from the original.  Chronic, worsening joint pain and swelling x 30month. No OTC used. Worse with activity. AM stiffness >361ms.  FHx of lupus and RA (aunt and cousins).  Unable to afford appt with [facility].  Antiphospholipid done 07/2021: mild elevation    Check ESR, CRP, ANA, and RH-factor  Entered referral to [facility]-rheumatology  Start meloxicam daily  Electronically signed by NcFlossie BuffyNP at 09/03/2021  3:09 PM EDT

## 2021-09-03 NOTE — Assessment & Plan Note (Signed)
Associated Problem(s): Elevated LDL cholesterol level  Formatting of this note is different from the original.  Reports change in diet to low fat and low carb. She has also maintain daily exercise regimen 3-5x/week.  Wt Readings from Last 3 Encounters:   09/03/21 141 lb (64 kg)   09/03/21 139 lb 12.8 oz (63.4 kg)   07/28/21 143 lb (64.9 kg)     Repeat lipid panel  Electronically signed by Anne Ng, NP at 09/03/2021  3:10 PM EDT

## 2021-09-03 NOTE — Assessment & Plan Note (Addendum)
Chronic, worsening joint pain and swelling x 38month. No OTC used. Worse with activity. AM stiffness >329ms. ?FHx of lupus and RA (aunt and cousins). ?Unable to afford appt with [facility]. ?Antiphospholipid done 07/2021: mild elevation ? ?Check ESR, CRP, ANA, and RH-factor ?Entered referral to [facility]-rheumatology ?Start meloxicam daily ?

## 2021-09-03 NOTE — Assessment & Plan Note (Signed)
Reports change in diet to low fat and low carb. She has also maintain daily exercise regimen 3-5x/week. ?Wt Readings from Last 3 Encounters:  ?09/03/21 141 lb (64 kg)  ?09/03/21 139 lb 12.8 oz (63.4 kg)  ?07/28/21 143 lb (64.9 kg)  ? ?Repeat lipid panel ?

## 2021-09-03 NOTE — Progress Notes (Signed)
Obstetrics and Gynecology Visit ?Return Patient Evaluation ? ?Appointment Date: 09/03/2021 ? ?Primary Care Provider: Anne Ng ? ?OB[facility] Clinic: [facility] ? ?Chief Complaint: f/u dyspareunia, heavy menstrual periods, RO cyst ? ?History of Present Illness:  Lindsey Carlson is a 41 y.o. with above CC.  ? ?Patient states she went to PT and it has helped her dyspareunia a lot. 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 ?Review of Systems: as noted in the History of Present Illness. ? ?Patient Active Problem List  ? Diagnosis Date Noted  ? Right ovarian cyst 09/03/2021  ? Dysmenorrhea 07/28/2021  ? Dyspareunia in female 07/28/2021  ? Menorrhagia with regular cycle 07/28/2021  ? History of lupus anticoagulant disorder 07/28/2021  ? Other specified abnormal immunological findings in serum 06/23/2020  ? Arthralgia 06/23/2020  ? Inflammatory arthritis 06/23/2020  ? Genetic testing 06/01/2019  ? Family history of breast cancer   ? Breast tenderness in female 04/24/2019  ? Tonsillitis, chronic 09/04/2018  ? 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 ?Medications:  ?Lindsey Carlson had no medications administered during this visit. ?No current outpatient medications on file.  ? ?No current facility-administered medications for this visit.  ? ? ?Allergies: has No Known Allergies. ? ?Physical Exam:  ?BP 116/77   Pulse 71   Ht 5\' 1"  (1.549 m)   Wt 139 lb 12.8 oz (63.4 kg)   BMI 26.41 kg/m?  Body mass index is 26.41 kg/m?. ?General appearance: Well nourished, well developed female in no acute distress.  ?Neuro/Psych:  Normal mood and affect.   ? ? ?Assessment: pt improved ? ?Plan: ? ?1. Dysmenorrhea ?Options reviewed and recommend Slynd or depo provera as could help with ovulation suppression if pt wants to prevent future ovarian cysts. I also said can consider Mirena, as well. Pt would like to continue with expectant  management ? ?2. Menorrhagia with regular cycle ? ?3. Dyspareunia in female ?I told her that this makes sense especially if she had multiple episotomies, given most are midline or right mediolateral.  ? ?4. Right ovarian cyst ? ? ?RTC: call pt after mid April f/u u/s for ovarian cyst.  ? ?Cornelia Copa MD ?Attending ?[facility]) ? ? ?

## 2021-09-03 NOTE — Progress Notes (Signed)
? ? ?               Established Patient Visit ? ?Patient: Lindsey Carlson   DOB: 1981-02-24   41 y.o. Female  MRN: [id number] ?Visit Date: 09/03/2021 ? ?Subjective:  ?  ?Chief Complaint  ?Patient presents with  ? Acute Visit  ?  Pt would like to discuss possible referral to Rheumatology.    ? ?HPI ?Inflammatory arthritis ?Chronic, worsening joint pain and swelling x 6months. No OTC used. Worse with activity. AM stiffness >9mins. ?FHx of lupus and RA (aunt and cousins). ?Unable to afford appt with [facility]. ?Antiphospholipid done 07/2021: mild elevation ? ?Check ESR, CRP, ANA, and RH-factor ?Entered referral to [facility]-rheumatology ?Start meloxicam daily ? ?Elevated LDL cholesterol level ?Reports change in diet to low fat and low carb. She has also maintain daily exercise regimen 3-5x/week. ?Wt Readings from Last 3 Encounters:  ?09/03/21 141 lb (64 kg)  ?09/03/21 139 lb 12.8 oz (63.4 kg)  ?07/28/21 143 lb (64.9 kg)  ? ?Repeat lipid panel ? ?  ? ?Medications: ?No outpatient medications prior to visit.  ? ?No facility-administered medications prior to visit.  ? ?Reviewed past medical and social history.  ?Review of Systems Per HPI ? ? ?   ?Objective:  ?  ?BP 100/60 (BP Location: Left Arm, Patient Position: Sitting, Cuff Size: Normal)   Pulse 72   Temp 98.3 ?F (36.8 ?C) (Temporal)   Ht $R'5\' 1"'rE$  (1.549 m)   Wt 141 lb (64 kg)   SpO2 98%   BMI 26.64 kg/m?  ? ? ?Physical Exam ?Vitals reviewed.  ?Cardiovascular:  ?   Rate and Rhythm: Normal rate.  ?   Pulses: Normal pulses.  ?Pulmonary:  ?   Effort: Pulmonary effort is normal.  ?Musculoskeletal:     ?   General: No swelling or deformity.  ?   Right lower leg: No edema.  ?   Left lower leg: No edema.  ?Skin: ?   Findings: No erythema or rash.  ?Neurological:  ?   Mental Status: She is oriented to person, place, and time.  ?  ?No results found for any visits on 09/03/21. ?   ?Assessment & Plan:  ?  ?Problem List Items Addressed This Visit   ? ?  ?  Musculoskeletal and Integument  ? Inflammatory arthritis - Primary  ?  Chronic, worsening joint pain and swelling x 73months. No OTC used. Worse with activity. AM stiffness >62mins. ?FHx of lupus and RA (aunt and cousins). ?Unable to afford appt with [facility]. ?Antiphospholipid done 07/2021: mild elevation ? ?Check ESR, CRP, ANA, and RH-factor ?Entered referral to [facility]-rheumatology ?Start meloxicam daily ?  ?  ? Relevant Medications  ? meloxicam (MOBIC) 15 MG tablet  ? Other Relevant Orders  ? Ambulatory referral to Rheumatology  ? C-reactive protein  ? Sedimentation rate  ? Rheumatoid Factor  ? ANA w/Reflex  ?  ? Other  ? Elevated LDL cholesterol level  ?  Reports change in diet to low fat and low carb. She has also maintain daily exercise regimen 3-5x/week. ?Wt Readings from Last 3 Encounters:  ?09/03/21 141 lb (64 kg)  ?09/03/21 139 lb 12.8 oz (63.4 kg)  ?07/28/21 143 lb (64.9 kg)  ? ?Repeat lipid panel ?  ?  ? Relevant Orders  ? Lipid panel  ? ?Return if symptoms worsen or fail to improve. ? ?  ? ?Wilfred Lacy, NP ? ? ?

## 2021-09-03 NOTE — Patient Instructions (Addendum)
You will be contacted to [facility] ? ?Schedule fasting lab appt.  ?

## 2021-09-09 ENCOUNTER — Encounter: Payer: Commercial Managed Care - PPO | Admitting: Physical Therapy

## 2021-09-16 ENCOUNTER — Encounter: Payer: Commercial Managed Care - PPO | Admitting: Physical Therapy

## 2021-09-23 ENCOUNTER — Encounter: Payer: Commercial Managed Care - PPO | Admitting: Physical Therapy

## 2021-09-30 ENCOUNTER — Encounter: Payer: Commercial Managed Care - PPO | Admitting: Physical Therapy

## 2021-09-30 ENCOUNTER — Ambulatory Visit: Payer: Commercial Managed Care - PPO | Attending: Obstetrics and Gynecology

## 2021-10-07 ENCOUNTER — Ambulatory Visit: Payer: Commercial Managed Care - PPO | Admitting: Obstetrics and Gynecology

## 2021-10-07 ENCOUNTER — Encounter: Payer: Commercial Managed Care - PPO | Admitting: Physical Therapy

## 2021-10-16 ENCOUNTER — Other Ambulatory Visit: Payer: Self-pay | Admitting: Nurse Practitioner

## 2021-10-16 DIAGNOSIS — M1990 Unspecified osteoarthritis, unspecified site: Secondary | ICD-10-CM

## 2021-10-26 NOTE — Telephone Encounter (Signed)
Chart supports rx refill ?Last ov: 09/03/21 ?Last refill: 09/19/21 ? ?

## 2021-12-15 ENCOUNTER — Ambulatory Visit
Admit: 2021-12-15 | Discharge: 2021-12-15 | Payer: PRIVATE HEALTH INSURANCE | Attending: Student in an Organized Health Care Education/Training Program | Primary: Student in an Organized Health Care Education/Training Program

## 2021-12-15 DIAGNOSIS — Z0000 Encounter for general adult medical examination without abnormal findings: Secondary | ICD-10-CM

## 2021-12-15 LAB — CBC
Hematocrit: 36.7 % (ref 35.0–47.0)
Hemoglobin: 11 g/dL — ABNORMAL LOW (ref 11.5–16.0)
MCH: 24.7 PG — ABNORMAL LOW (ref 26.0–34.0)
MCHC: 30 g/dL (ref 30.0–36.5)
MCV: 82.3 FL (ref 80.0–99.0)
MPV: 10.9 FL (ref 8.9–12.9)
Nucleated RBCs: 0 PER 100 WBC
Platelets: 297 10*3/uL (ref 150–400)
RBC: 4.46 M/uL (ref 3.80–5.20)
RDW: 15.4 % — ABNORMAL HIGH (ref 11.5–14.5)
WBC: 5.2 10*3/uL (ref 3.6–11.0)
nRBC: 0 10*3/uL (ref 0.00–0.01)

## 2021-12-15 NOTE — Progress Notes (Signed)
Chief Complaint   Patient presents with    New Patient     Patient diagnosed with RA. Needs a referral to rheumatology.      Vitals:    12/15/21 0901   BP: 126/87   Site: Right Upper Arm   Position: Sitting   Cuff Size: Medium Adult   Pulse: 70   Resp: 18   Temp: 97.8 F (36.6 C)   TempSrc: Temporal   SpO2: 98%   Weight: 141 lb 3.2 oz (64 kg)   Height: 5\' 1"  (1.549 m)     1. Have you been to the ER, urgent care clinic since your last visit?  Hospitalized since your last visit?No    2. Have you seen or consulted any other health care providers outside of the [facility] since your last visit?  Include any pap smears or colon screening. No

## 2021-12-15 NOTE — Progress Notes (Signed)
[address]  [place], [place] [address]   Office [phone], Fax [phone]    Subjective:     Chief Complaint   Patient presents with    New Patient     Patient diagnosed with RA. Needs a referral to rheumatology.        HPI:  Brandi Cox is a 41 y.o. female with a history of Rheumatoid Arthritis and breast mass that presents for:    New Patient:     Social:   Works: English as a second language teacher, previously [employer] with: Daughter and fiance and step kids  Revious PCP: Moved from [place], [place]  Tobacco: No  Alcohol: Occasionally, glass of wine about once a week  Drug Use: No      Medical History:  Rheumatoid Arthritis: Dx in 2020 in [place]   - requesting referral today  - strong family history of RA and Lupus  - only managing with Motrin at this time, has taken other medications  (Methotrexate) previously but unable to tolerate due to vomiting  - feels symptoms continue to worsen  - taking tumeric and ginger supplement    Left Breast Mass: left lateral breast, being monitored  - previously told to get mamograms yearly  - 6 aunts on fathers side, all with breast cancer. High Risk  - due for next mammogram now  - no fevers, weight loss, night sweats    Hyperlipidemia: unsure about specifics, but patient was told she needed to follow up     Constipation:   - taking metamucil     Surgical History:   - Nasal turbinate modification  - Tubal ligation bilateral          Health Maintenance:  Health Maintenance Due   Topic Date Due    Varicella vaccine (1 of 2 - 2-dose childhood series) Never done    HIV screen  Never done    Hepatitis C screen  Never done    Cervical cancer screen  Never done    Diabetes screen  Never done    Lipids  Never done    COVID-19 Vaccine (3 - Booster for Pfizer series) 02/18/2020          Past Medical Hx  I personally reviewed.  Past Medical History:   Diagnosis Date    Rheumatoid arthritis (HCC) 2020        SocHx   I personally reviewed.  Social History     Socioeconomic  History    Marital status: Single     Spouse name: Not on file    Number of children: Not on file    Years of education: Not on file    Highest education level: Not on file   Occupational History    Not on file   Tobacco Use    Smoking status: Never    Smokeless tobacco: Never   Vaping Use    Vaping Use: Never used   Substance and Sexual Activity    Alcohol use: Yes    Drug use: Never    Sexual activity: Not on file   Other Topics Concern    Not on file   Social History Narrative    Not on file     Social Determinants of Health     Financial Resource Strain: Unknown    Difficulty of Paying Living Expenses: Patient refused   Food Insecurity: Unknown    Worried About Running Out of Food in the Last Year: Patient refused  Ran Out of Food in the Last Year: Patient refused   Transportation Needs: Unknown    Freight forwarder (Medical): Not on file    Lack of Transportation (Non-Medical): Patient refused   Physical Activity: Not on file   Stress: Not on file   Social Connections: Not on file   Intimate Partner Violence: Not on file   Housing Stability: Unknown    Unable to Pay for Housing in the Last Year: Not on file    Number of Places Lived in the Last Year: Not on file    Unstable Housing in the Last Year: Patient refused        Allergies  I personally reviewed.  No Known Allergies     Medications  I personally reviewed.  No current outpatient medications on file prior to visit.     No current facility-administered medications on file prior to visit.        ROS:    Review of Systems   Constitutional:  Negative for activity change, appetite change and fever.   HENT:  Negative for ear pain and sinus pain.    Eyes:  Negative for pain and visual disturbance.   Respiratory:  Negative for cough, chest tightness, shortness of breath and wheezing.    Cardiovascular:  Negative for chest pain and palpitations.   Gastrointestinal:  Negative for abdominal pain, blood in stool, nausea and vomiting.   Genitourinary:   Negative for difficulty urinating, dysuria, flank pain and hematuria.   Musculoskeletal:  Positive for arthralgias and joint swelling. Negative for neck pain.   Skin:  Negative for rash and wound.   Neurological:  Negative for dizziness, weakness and light-headedness.   Psychiatric/Behavioral:  Negative for agitation, confusion and hallucinations.            Objective:   Vitals  I personally reviewed.  BP 126/87 (Site: Right Upper Arm, Position: Sitting, Cuff Size: Medium Adult)   Pulse 70   Temp 97.8 F (36.6 C) (Temporal)   Resp 18   Ht 5\' 1"  (1.549 m)   Wt 141 lb 3.2 oz (64 kg)   SpO2 98%   BMI 26.68 kg/m      Physical Exam:    Physical Exam  Constitutional:       Appearance: Normal appearance.   HENT:      Right Ear: External ear normal.      Left Ear: External ear normal.   Cardiovascular:      Rate and Rhythm: Normal rate and regular rhythm.      Pulses: Normal pulses.      Heart sounds: Normal heart sounds.   Pulmonary:      Effort: Pulmonary effort is normal. No respiratory distress.      Breath sounds: Normal breath sounds. No wheezing.   Neurological:      General: No focal deficit present.      Mental Status: She is alert and oriented to person, place, and time.        Notable Labs and Imaging:   NA     Assessment/Plan:   Assessment:   41yo female with hx of RA and left breast mass presents as a new patient to establish care. No acute cocnerns today.     Initial blood work as below.   Referral placed for Rheumatology. Will discuss with patient at next visit as she may need to try outside of network in order to cut down on wait times.   Mamogram scheduled for  follow up left breast mass.   Follow up in 1 month with previous records for [name]      1. Healthcare maintenance  - MAM DIGITAL SCREEN W OR WO CAD BILATERAL; Future  - Lipid Panel; Future  - CBC; Future  - Comprehensive Metabolic Panel; Future  - HIV 1/2 Ag/Ab, 4TH Generation,W Rflx Confirm; Future  - Hepatitis C Antibody;  Future    2. Rheumatoid arthritis involving multiple sites with positive rheumatoid factor (HCC)  Stable, currently only taking NSAIDs for management. Has tried multiple medications including methotrexate but has been unable to tolerate.   - Lipid Panel; Future  - CBC; Future  - Comprehensive Metabolic Panel; Future  - [facility] - [employer], Rheumatology, [place] ([address])    3. Mass of lower outer quadrant of left breast  - MAM DIGITAL SCREEN W OR WO CAD BILATERAL; Future    4. Constipation, unspecified constipation type    5. Hx of tubal ligation      Return in about 1 month (around 01/14/2022) for [facility] Visit.         Pt was discussed with Dr Wendie Simmer (attending physician).    I have reviewed patient medical and social history and medications.  I have reviewed pertinent labs results and other data. I have discussed the diagnosis with the patient and the intended plan as seen in the above orders. The patient has received an after-visit summary and questions were answered concerning future plans. I have discussed medication side effects and warnings with the patient as well.    Carmina Miller, MD  Resident [facility]

## 2021-12-15 NOTE — Progress Notes (Signed)
I reviewed with the resident the medical history and the resident's findings on the physical examination.  I discussed with the resident the patient's diagnosis and concur with the plan.

## 2021-12-16 LAB — COMPREHENSIVE METABOLIC PANEL
ALT: 23 U/L (ref 12–78)
AST: 13 U/L — ABNORMAL LOW (ref 15–37)
Albumin/Globulin Ratio: 1 — ABNORMAL LOW (ref 1.1–2.2)
Albumin: 3.9 g/dL (ref 3.5–5.0)
Alk Phosphatase: 66 U/L (ref 45–117)
Anion Gap: 6 mmol/L (ref 5–15)
BUN: 10 MG/DL (ref 6–20)
Bun/Cre Ratio: 17 (ref 12–20)
CO2: 27 mmol/L (ref 21–32)
Calcium: 9.2 MG/DL (ref 8.5–10.1)
Chloride: 107 mmol/L (ref 97–108)
Creatinine: 0.6 MG/DL (ref 0.55–1.02)
Est, Glom Filt Rate: >60 mL/min/{1.73_m2} (ref >60)
Globulin: 3.8 g/dL (ref 2.0–4.0)
Glucose: 88 mg/dL (ref 65–100)
Potassium: 3.9 mmol/L (ref 3.5–5.1)
Sodium: 140 mmol/L (ref 136–145)
Total Bilirubin: 0.5 MG/DL (ref 0.2–1.0)
Total Protein: 7.7 g/dL (ref 6.4–8.2)

## 2021-12-16 LAB — LIPID PANEL
Chol/HDL Ratio: 3.4 (ref 0.0–5.0)
Cholesterol, Total: 177 MG/DL (ref <200)
HDL: 52 MG/DL
LDL Calculated: 108.4 MG/DL — ABNORMAL HIGH (ref 0–100)
Triglycerides: 83 MG/DL (ref <150)
VLDL Cholesterol Calculated: 16.6 MG/DL

## 2021-12-16 LAB — HEPATITIS C ANTIBODY: Interpretation: NONREACTIVE

## 2021-12-16 LAB — HIV 1/2 AG/AB, 4TH GENERATION,W RFLX CONFIRM: HIV 1/2 Interp: NONREACTIVE

## 2022-01-18 ENCOUNTER — Ambulatory Visit
Payer: PRIVATE HEALTH INSURANCE | Attending: Student in an Organized Health Care Education/Training Program | Primary: Student in an Organized Health Care Education/Training Program

## 2022-01-19 ENCOUNTER — Ambulatory Visit
Payer: PRIVATE HEALTH INSURANCE | Attending: Obstetrics & Gynecology | Primary: Student in an Organized Health Care Education/Training Program

## 2022-01-19 DIAGNOSIS — Z01419 Encounter for gynecological examination (general) (routine) without abnormal findings: Secondary | ICD-10-CM

## 2022-01-19 NOTE — Progress Notes (Deleted)
Brandi Cox is a 41 y.o. female returns for an annual exam     No chief complaint on file.      No LMP recorded.  Her periods are {bleeding vaginal:11356} in flow and {cycle hx:60113}.  She {has-does not have:30011153} dysmenorrhea.  Problems: {problem:16946}  Birth Control: {contraception:315051}.  Last Pap: {Normal_Abnormal:40215::"see report"} obtained {Numbers; 1-5 :30010866} year(s) ago.  She {DOES_DOES QQI:29798} have a history of CIN 2, 3 or cervical cancer.   Last Mammogram: {mammo:41044}.  It was {Normal_Abnormal:40215::"see report"}.   Last Bone Density: {Normal_Abnormal:40215::"see report"} obtained {Numbers; 1-5:30010866} year(s) ago.  Last colonoscopy: {Normal_Abnormal:40215::"see report"} obtained {Numbers; 1-5:30010866} year(s) ago.      Examination chaperoned by Kae Heller, MA.

## 2022-01-25 ENCOUNTER — Ambulatory Visit
Payer: PRIVATE HEALTH INSURANCE | Attending: Student in an Organized Health Care Education/Training Program | Primary: Student in an Organized Health Care Education/Training Program

## 2022-02-08 ENCOUNTER — Ambulatory Visit
Payer: PRIVATE HEALTH INSURANCE | Attending: Student in an Organized Health Care Education/Training Program | Primary: Student in an Organized Health Care Education/Training Program

## 2022-03-01 ENCOUNTER — Ambulatory Visit
Admit: 2022-03-01 | Discharge: 2022-03-01 | Payer: PRIVATE HEALTH INSURANCE | Attending: Student in an Organized Health Care Education/Training Program | Primary: Student in an Organized Health Care Education/Training Program

## 2022-03-01 DIAGNOSIS — R0789 Other chest pain: Secondary | ICD-10-CM

## 2022-03-01 MED ORDER — AMLODIPINE BESYLATE 5 MG PO TABS
5 MG | ORAL_TABLET | Freq: Every day | ORAL | 0 refills | Status: AC
Start: 2022-03-01 — End: ?

## 2022-03-01 MED ORDER — FAMOTIDINE 20 MG PO TABS
20 MG | ORAL_TABLET | Freq: Every evening | ORAL | 3 refills | Status: AC
Start: 2022-03-01 — End: ?

## 2022-03-01 NOTE — Progress Notes (Unsigned)
Paytin Eiser is a 41 y.o. female    Chief Complaint   Patient presents with    Follow-up     RA, and Chest pain, Declined having pain right now but did have it earlier when driving.       1. Have you been to the ER, urgent care clinic since your last visit?  Hospitalized since your last visit?no    2. Have you seen or consulted any other health care providers outside of the [facility] since your last visit?  Include any pap smears or colon screening. no    Vitals:    03/01/22 1623   BP: (!) 152/89   Pulse: 80   Resp: 14   Temp: 98.2 F (36.8 C)   SpO2: 98%          No data to display              Health Maintenance Due   Topic Date Due    Hepatitis B vaccine (1 of 3 - 3-dose series) Never done    COVID-19 Vaccine (1) Never done    Varicella vaccine (1 of 2 - 2-dose childhood series) Never done    Cervical cancer screen  Never done    Flu vaccine (1) Never done

## 2022-03-01 NOTE — Progress Notes (Signed)
[facility]      Chief Complaint:     Chief Complaint   Patient presents with    Follow-up     RA, and Chest pain, Declined having pain right now but did have it earlier when driving.       Brandi Cox is a 41 y.o. female that presents for: Chest pain      Assessment/Plan:     Pt is a 41 year old female with previously diagnosed RA not currently on DMARD therapy given intolerance presenting with complaints of brief, intermittent episodes of chest pain and sensations of her heart skipping a beat.    Brandi Cox was seen today for follow-up.    Diagnoses and all orders for this visit:    Other chest pain: brief (lasting less than 1 minute), intermittent, and substernal. No chest pain currently. Does not change or start with positional change, eating, or exertion. Could represent atypical or even prinzmetal angina. Location in center of chest and intermittent dysphagia could also indicate esophageal spasm. EKG with RSR in V1 which may be a normal variant, QRS duration 90 msec. T-wave inversion in V2 and V3 but no acute ST elevation or depression. No prior EKG to compare to. Will refer for stress Echo.   -     AMB POC EKG ROUTINE  -     Stress echocardiogram (TTE) exercise with contrast, bubble, strain, and 3D PRN; Future    Palpitation: as above, brief, lasting seconds. No inciting factors identified. Recent calcium, potassium levels normal. Will check TSH/T4 and Mg level and refer for stress echo. NSR on EKG. If work up all negative consider referral to cards for event monitor.   -     AMB POC EKG ROUTINE  -     Stress echocardiogram (TTE) exercise with contrast, bubble, strain, and 3D PRN; Future  -     Thyroid Cascade Profile; Future  -     Magnesium; Future    Dysphagia, unspecified type: intermittent. Symptoms c/w oropharyngeal dysphagia verus early esophageal. Given intermittent chest pain esophageal irritation/spasms may be underlying cause and represent reflux. Stat=rt trial of H2 blocker. If  symptoms persist will consider referral for swallow study vs GI referral for EGD.   -     famotidine (PEPCID) 20 MG tablet; Take 1 tablet by mouth nightly    Essential (primary) hypertension: 152/89 today and 126/87 3 months prior. Start on Amlodipine 5 daily and advised to increase to 2 pills/10 mg daily if BP persistently elevated above 130/80 over the next 7 days. Follow up in 2 weeks with home BP log.   -     amLODIPine (NORVASC) 5 MG tablet; Take 1 tablet by mouth daily           Follow up:     Return in about 2 weeks (around 03/15/2022) for BP recheck..     Subjective:   HPI:  Brandi Cox is a 41 y.o. female that presents for:    Intermittent CP and sensations of heart skipping a beat. Pt states symptoms have been going on for a couple of months now. The chest pain occurs just behind the mid-sternum, and usually occurs at the same time she has a sensation of her heart skipping a beat. Feels like she has to take a deep breath when this happens. Episodes never last more than a couple seconds and she may have 1-2 episodes per day. Seem to happen randomly without any inciting factor. Denies  aggravation with positional change, after/during eating, or with exertion. She denies exertional dyspnea or orthopnea. She denies any n/v or abdominal pain but does endorse occasional dysphagia where it feels like food is getting stuck near the top of her throat.These symptoms are also intermittent. Denies heart burn specifically. She also denies LOC, dizziness, or weakness, or radiating pain to the jaw, shoulder, or arm. No recent illness. She denies h/o panic attacks or anxiety but does say she has been feeling stressed recently. No other symptoms at this time and denies current symptoms of chest pain or heart flutters.       Health Maintenance:  Health Maintenance Due   Topic Date Due    Hepatitis B vaccine (1 of 3 - 3-dose series) Never done    COVID-19 Vaccine (1) Never done    Varicella vaccine (1 of 2 - 2-dose childhood  series) Never done    Cervical cancer screen  Never done    Flu vaccine (1) Never done        Review of Systems  Per HPI.      Past medical history, social history, and medications personally reviewed.  Past Medical History:   Diagnosis Date    Rheumatoid arthritis (HCC) 2020        Allergies personally reviewed.  No Known Allergies       Objective:   Vitals reviewed.  BP (!) 152/89 (Site: Right Upper Arm, Position: Sitting, Cuff Size: Medium Adult)   Pulse 80   Temp 98.2 F (36.8 C)   Resp 14   Ht 5\' 1"  (1.549 m)   Wt 141 lb 6.4 oz (64.1 kg)   SpO2 98%   BMI 26.72 kg/m      Wt Readings from Last 3 Encounters:   03/01/22 141 lb 6.4 oz (64.1 kg)   12/15/21 141 lb 3.2 oz (64 kg)        Physical Exam  Constitutional:       General: She is not in acute distress.     Appearance: Normal appearance. She is not ill-appearing.   Cardiovascular:      Rate and Rhythm: Normal rate and regular rhythm.      Pulses: Normal pulses.      Heart sounds: Normal heart sounds. No murmur heard.     No friction rub. No gallop.   Pulmonary:      Effort: Pulmonary effort is normal.      Breath sounds: Normal breath sounds. No wheezing, rhonchi or rales.   Abdominal:      General: Abdomen is flat. Bowel sounds are normal.      Palpations: Abdomen is soft.      Tenderness: There is no abdominal tenderness.   Musculoskeletal:      Cervical back: Neck supple. No tenderness.      Right lower leg: No edema.      Left lower leg: No edema.   Lymphadenopathy:      Cervical: No cervical adenopathy.   Skin:     General: Skin is warm and dry.      Capillary Refill: Capillary refill takes less than 2 seconds.   Neurological:      General: No focal deficit present.      Mental Status: She is alert and oriented to person, place, and time.   Psychiatric:         Mood and Affect: Mood normal.         Behavior: Behavior normal.  I have reviewed pertinent labs results and other data. I have discussed the diagnosis with the patient and the  intended plan as seen in the above orders. The patient has received an after-visit summary and questions were answered concerning future plans. I have discussed medication side effects and warnings with the patient as well.    Pt discussed with Dr. Su Hilt (attending physician)    Rory Percy, MD  03/01/22

## 2022-03-02 NOTE — Progress Notes (Signed)
I discussed the findings, assessment and plan with the resident and agree with the resident's findings and plan as documented in the resident's note.

## 2022-03-22 ENCOUNTER — Ambulatory Visit
Admit: 2022-03-22 | Discharge: 2022-03-22 | Payer: PRIVATE HEALTH INSURANCE | Attending: Student in an Organized Health Care Education/Training Program | Primary: Student in an Organized Health Care Education/Training Program

## 2022-03-22 DIAGNOSIS — R0789 Other chest pain: Secondary | ICD-10-CM

## 2022-03-22 NOTE — Progress Notes (Unsigned)
[facility]  [facility]       Chief Complaint:     Chief Complaint   Patient presents with    Blood Pressure Check       Subjective:   HPI:  Brandi Cox is a 41 y.o. female that presents for:    HTN:  - BP at home: 120/80, sometimes even less  - Current medications: Norvasc 5mg   - Denies chest pain, vision changes, headache, SOB  - Diet: ***  - Exercise: ***    Reports chest pain / palpitations - feels like hearts stopping for a second. Happening more often but not as severe. Not a sharp pain.     ROS: See HPI for pertinent ROS.     Past medical history, social history, medications, and allergies personally reviewed.  Past Medical History:   Diagnosis Date    Rheumatoid arthritis (HCC) 2020       Social Hx:   Social Determinants of Health     Tobacco Use: Low Risk  (12/15/2021)    Patient History     Smoking Tobacco Use: Never     Smokeless Tobacco Use: Never     Passive Exposure: Not on file   Alcohol Use: Not on file   Financial Resource Strain: Unknown (12/15/2021)    Overall Financial Resource Strain (CARDIA)     Difficulty of Paying Living Expenses: Patient refused   Food Insecurity: Unknown (12/15/2021)    Hunger Vital Sign     Worried About Running Out of Food in the Last Year: Patient refused     Ran Out of Food in the Last Year: Patient refused   Transportation Needs: Unknown (12/15/2021)    PRAPARE - 12/17/2021 (Medical): Not on file     Lack of Transportation (Non-Medical): Patient refused   Physical Activity: Not on file   Stress: Not on file   Social Connections: Not on file   Intimate Partner Violence: Not on file   Depression: Not at risk (12/15/2021)    PHQ-2     PHQ-2 Score: 0   Housing Stability: Unknown (12/15/2021)    Housing Stability Vital Sign     Unable to Pay for Housing in the Last Year: Not on file     Number of Places Lived in the Last Year: Not on file     Unstable Housing in the Last Year: Patient refused         Medications:   Current Outpatient Medications   Medication Sig    amLODIPine (NORVASC) 5 MG tablet Take 1 tablet by mouth daily    famotidine (PEPCID) 20 MG tablet Take 1 tablet by mouth nightly (Patient not taking: Reported on 03/22/2022)     No current facility-administered medications for this visit.        Allergies:  No Known Allergies     Health Maintenance:  Health Maintenance Due   Topic Date Due    Hepatitis B vaccine (1 of 3 - 3-dose series) Never done    Varicella vaccine (1 of 2 - 2-dose childhood series) Never done    Cervical cancer screen  Never done    COVID-19 Vaccine (3 - Pfizer series) 02/18/2020    Flu vaccine (1) 01/19/2022        Objective:   Vitals reviewed.  Vitals:    03/22/22 1627   BP: 124/88   Site: Right Upper Arm   Position: Sitting  Cuff Size: Medium Adult   Pulse: 93   Resp: 14   Temp: 98.4 F (36.9 C)   SpO2: 97%   Weight: 134 lb 9.6 oz (61.1 kg)   Height: 5\' 1"  (1.549 m)       Physical Exam:  General Alert. No distress. Not diaphoretic. No jaundice, cyanosis, pallor.     Thyroid  No thyroid enlargement or tenderness.   HENT Head    Normocephalic and atraumatic.   Eyes    Conjunctivae pink, no discharge. No scleral icterus. EOMI.    Cardio Normal rate, regular rhythm. No murmur, gallop, or friction rub. No chest wall tenderness.    Pulmonary Effort normal. No respiratory distress. No wheezes, rhales, or rhonchi.    Abdominal Soft. Bowel sounds normal. No distension. No tenderness.    GU Deferred.    Extremities No edema of lower extremities. No tenderness.   Neurological No focal deficits.   Skin Skin is warm and dry. No rash noted. No erythema.    Psychiatric Mood, affect, and judgment normal.        Assessment/Plan:     {There are no diagnoses linked to this encounter. (Refresh or delete this SmartLink)}     Follow up:   No follow-ups on file.    Pt was discussed with Dr. Marland Kitchen (attending physician).    I have reviewed pertinent labs results and other data. I have discussed the  diagnosis with the patient and the intended plan as seen in the above orders. The patient has received an after-visit summary and questions were answered concerning future plans. I have discussed medication side effects and warnings with the patient as well.    Tiburcio Pea, DO  PGY-3 Resident - [facility] [place] [facility]  03/22/22

## 2022-03-22 NOTE — Progress Notes (Unsigned)
Brandi Cox is a 41 y.o. female    Chief Complaint   Patient presents with    Blood Pressure Check       1. Have you been to the ER, urgent care clinic since your last visit?  Hospitalized since your last visit?no    2. Have you seen or consulted any other health care providers outside of the [place] since your last visit?  Include any pap smears or colon screening. no    Vitals:    03/22/22 1627   BP: 124/88   Pulse: 93   Resp: 14   Temp: 98.4 F (36.9 C)   SpO2: 97%          No data to display              Health Maintenance Due   Topic Date Due    Hepatitis B vaccine (1 of 3 - 3-dose series) Never done    Varicella vaccine (1 of 2 - 2-dose childhood series) Never done    Cervical cancer screen  Never done    COVID-19 Vaccine (3 - Pfizer series) 02/18/2020    Flu vaccine (1) 01/19/2022

## 2022-03-22 NOTE — Patient Instructions (Addendum)
Please call this number to schedule your imaging: [phone].    Deno Lunger, MD, Gastroenterology  [address]   [place]  [place] [address]  [phone]

## 2022-03-23 ENCOUNTER — Encounter

## 2022-03-23 MED ORDER — AMLODIPINE BESYLATE 5 MG PO TABS
5 MG | ORAL_TABLET | Freq: Every day | ORAL | 3 refills | Status: DC
Start: 2022-03-23 — End: 2022-05-05

## 2022-03-26 NOTE — Progress Notes (Signed)
I discussed the findings, assessment and plan with the resident and agree with the resident's findings and plan as documented in the resident's note.

## 2022-04-30 NOTE — Telephone Encounter (Signed)
-----   Message from Jasper Riling sent at 04/21/2022  9:44 AM EDT -----  Subject: Appointment Request    Reason for Call: New Patient/New to Provider Appointment needed:   Semi-Routine Cough, Cold Symptoms    QUESTIONS    Reason for appointment request? No appointments available during search     Additional Information for Provider? Pt is experiencing headache, body   aches and scratchy throat and would like to be schedule to see someone.   Please call Pt and advise.  ---------------------------------------------------------------------------  --------------  Rod Can INFO  [phone]; OK to leave message on voicemail  ---------------------------------------------------------------------------  --------------  SCRIPT ANSWERS

## 2022-05-05 ENCOUNTER — Encounter

## 2022-05-05 MED ORDER — AMLODIPINE BESYLATE 5 MG PO TABS
5 MG | ORAL_TABLET | Freq: Every day | ORAL | 3 refills | Status: AC
Start: 2022-05-05 — End: ?

## 2022-05-07 ENCOUNTER — Ambulatory Visit: Payer: Commercial Managed Care - PPO | Admitting: Nurse Practitioner

## 2022-05-10 ENCOUNTER — Ambulatory Visit: Payer: Commercial Managed Care - PPO | Admitting: Nurse Practitioner

## 2022-05-10 ENCOUNTER — Ambulatory Visit (INDEPENDENT_AMBULATORY_CARE_PROVIDER_SITE_OTHER): Payer: Commercial Managed Care - PPO | Admitting: Family Medicine

## 2022-05-10 ENCOUNTER — Encounter: Payer: Self-pay | Admitting: Family Medicine

## 2022-05-10 VITALS — BP 142/84 | HR 77 | Temp 97.6°F | Wt 136.0 lb

## 2022-05-10 DIAGNOSIS — M069 Rheumatoid arthritis, unspecified: Secondary | ICD-10-CM

## 2022-05-10 DIAGNOSIS — I10 Essential (primary) hypertension: Secondary | ICD-10-CM | POA: Insufficient documentation

## 2022-05-10 DIAGNOSIS — N6321 Unspecified lump in the left breast, upper outer quadrant: Secondary | ICD-10-CM | POA: Diagnosis not present

## 2022-05-10 DIAGNOSIS — M1990 Unspecified osteoarthritis, unspecified site: Secondary | ICD-10-CM

## 2022-05-10 DIAGNOSIS — Z124 Encounter for screening for malignant neoplasm of cervix: Secondary | ICD-10-CM | POA: Diagnosis not present

## 2022-05-10 DIAGNOSIS — R002 Palpitations: Secondary | ICD-10-CM | POA: Diagnosis not present

## 2022-05-10 MED ORDER — MELOXICAM 15 MG PO TABS: 15.0000 mg | ORAL_TABLET | Freq: Every day | ORAL | 2 refills | Status: DC | PRN

## 2022-05-10 MED ORDER — AMLODIPINE BESYLATE 10 MG PO TABS: 10.0000 mg | ORAL_TABLET | Freq: Every day | ORAL | 0 refills | Status: DC

## 2022-05-10 NOTE — Patient Instructions (Signed)
For blood pressure, we are increasing amlodipine to 10 mg. Check blood pressure a few times a week and keep a journal.  For palpitations, we are referring to cardiology.  For RA, we are prescribing meloxicam for pain and referring to rheumatology.  For breast mass, we are ordering mammogram and referring to ob/gyn.

## 2022-05-10 NOTE — Assessment & Plan Note (Signed)
We will restart meloxicam as needed for ongoing pain Will refer to rheumatology for further work-up and immunosuppression

## 2022-05-10 NOTE — Assessment & Plan Note (Signed)
We will repeat mammography and refer to OB/GYN for further follow-up

## 2022-05-10 NOTE — Assessment & Plan Note (Signed)
Borderline elevated Will increase amlodipine to 10 mg Follow-up 3 months
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
## 2022-05-10 NOTE — Assessment & Plan Note (Signed)
Intermittent palpitations in setting of hypertension and inflammatory arthropathy Given intermittent mid to high risk of potential cardiac involvement will refer to cardiology for further assessment

## 2022-05-17 ENCOUNTER — Ambulatory Visit: Payer: Commercial Managed Care - PPO | Attending: Internal Medicine | Admitting: Internal Medicine

## 2022-05-17 ENCOUNTER — Encounter: Payer: Self-pay | Admitting: Internal Medicine

## 2022-05-17 VITALS — BP 120/78 | HR 63 | Ht 61.0 in | Wt 136.2 lb

## 2022-05-17 DIAGNOSIS — R002 Palpitations: Secondary | ICD-10-CM | POA: Diagnosis not present

## 2022-05-17 NOTE — Patient Instructions (Signed)
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
## 2022-05-18 ENCOUNTER — Telehealth: Payer: Self-pay

## 2022-05-18 ENCOUNTER — Other Ambulatory Visit: Payer: Self-pay | Admitting: Family Medicine

## 2022-05-18 DIAGNOSIS — N6321 Unspecified lump in the left breast, upper outer quadrant: Secondary | ICD-10-CM

## 2022-05-18 NOTE — Telephone Encounter (Signed)
Faxed ROI for RA labs from [facility].

## 2022-06-28 ENCOUNTER — Ambulatory Visit (INDEPENDENT_AMBULATORY_CARE_PROVIDER_SITE_OTHER): Payer: 59 | Admitting: Nurse Practitioner

## 2022-06-28 ENCOUNTER — Other Ambulatory Visit (HOSPITAL_COMMUNITY)
Admission: RE | Admit: 2022-06-28 | Discharge: 2022-06-28 | Disposition: A | Discharge status: Home / Self Care | Payer: Medicaid Other | Source: Ambulatory Visit | Attending: Nurse Practitioner | Admitting: Nurse Practitioner

## 2022-06-28 ENCOUNTER — Encounter: Payer: Self-pay | Admitting: Nurse Practitioner

## 2022-06-28 VITALS — BP 112/72 | HR 93 | Temp 97.5°F | Ht 61.0 in | Wt 137.0 lb

## 2022-06-28 DIAGNOSIS — M2559 Pain in other specified joint: Secondary | ICD-10-CM | POA: Diagnosis not present

## 2022-06-28 DIAGNOSIS — N760 Acute vaginitis: Secondary | ICD-10-CM

## 2022-06-28 DIAGNOSIS — B9689 Other specified bacterial agents as the cause of diseases classified elsewhere: Secondary | ICD-10-CM

## 2022-06-28 DIAGNOSIS — D500 Iron deficiency anemia secondary to blood loss (chronic): Secondary | ICD-10-CM

## 2022-06-28 DIAGNOSIS — M791 Myalgia, unspecified site: Secondary | ICD-10-CM | POA: Diagnosis not present

## 2022-06-28 DIAGNOSIS — N920 Excessive and frequent menstruation with regular cycle: Secondary | ICD-10-CM

## 2022-06-28 DIAGNOSIS — M0579 Rheumatoid arthritis with rheumatoid factor of multiple sites without organ or systems involvement: Secondary | ICD-10-CM

## 2022-06-28 DIAGNOSIS — M069 Rheumatoid arthritis, unspecified: Secondary | ICD-10-CM

## 2022-06-28 LAB — C-REACTIVE PROTEIN: CRP: <1 mg/dL (ref 0.5–20.0)

## 2022-06-28 LAB — VITAMIN D 25 HYDROXY (VIT D DEFICIENCY, FRACTURES): VITD: 20 ng/mL — ABNORMAL LOW (ref 30.00–100.00)

## 2022-06-28 LAB — SEDIMENTATION RATE: Sed Rate: 42 mm/hr — ABNORMAL HIGH (ref 0–20)

## 2022-06-28 NOTE — Patient Instructions (Signed)
Go to lab Need to obtain records from previous rheumatologist.

## 2022-06-28 NOTE — Progress Notes (Signed)
Established Patient Visit  Patient: Lindsey Carlson   DOB: 06/25/1980   42 y.o. Female  MRN: [id number] Visit Date: 06/28/2022  Subjective:    Chief Complaint  Patient presents with   Acute Visit    C/o watery / smelly vaginal discharge after cycle 1 week ago  Denies any itching or vaginal pain  RA discussion, could feel her veins pulsating in her legs and thighs last night    Vaginal Discharge The patient's primary symptoms include a genital odor and vaginal discharge. The patient's pertinent negatives include no genital itching, genital lesions, genital rash, missed menses, pelvic pain or vaginal bleeding. This is a new problem. The current episode started in the past 7 days. The problem occurs constantly. The problem has been unchanged. The patient is experiencing no pain. She is not pregnant. Pertinent negatives include no abdominal pain, anorexia, back pain, chills, constipation, diarrhea, discolored urine, dysuria, fever, flank pain, frequency, headaches, hematuria, painful intercourse, rash or urgency. The vaginal discharge was thin and clear. The vaginal bleeding is typical of menses. She has not been passing clots. She has not been passing tissue. Nothing aggravates the symptoms. She has tried nothing for the symptoms. She is sexually active. It is unknown whether or not her partner has an STD. She uses nothing for contraception. Her menstrual history has been regular. Her past medical history is significant for menorrhagia. There is no history of vaginosis.   Rheumatoid arthritis (HCC) Persistent myalgia, multiple joint pain, stiffness and fatigue. Has not been able to schedule appt with rheumatology due to lack of records from previous rheumatologist (requested twice)  No joint redness or swelling or deformity noted today. Advised to to get records from previous rheumatologist. No improvement with meloxicam and reports nausea with med despite taking with food. Get  arthritis panel today  Reviewed medical, surgical, and social history today  Medications: Outpatient Medications Prior to Visit  Medication Sig   amLODipine (NORVASC) 10 MG tablet Take 1 tablet (10 mg total) by mouth daily.   [DISCONTINUED] meloxicam (MOBIC) 15 MG tablet Take 1 tablet (15 mg total) by mouth daily as needed for pain. (Patient not taking: Reported on 06/28/2022)   No facility-administered medications prior to visit.   Reviewed past medical and social history.   ROS per HPI above      Objective:  BP 112/72 (BP Location: Right Arm, Patient Position: Sitting, Cuff Size: Small)   Pulse 93   Temp (!) 97.5 F (36.4 C) (Temporal)   Ht 5\' 1"  (1.549 m)   Wt 137 lb (62.1 kg)   LMP 06/14/2022 (Exact Date)   SpO2 98%   BMI 25.89 kg/m      Physical Exam Vitals reviewed.  Constitutional:      General: She is not in acute distress. Cardiovascular:     Rate and Rhythm: Normal rate.     Pulses: Normal pulses.  Pulmonary:     Effort: Pulmonary effort is normal.  Abdominal:     Hernia: There is no hernia in the left inguinal area or right inguinal area.  Musculoskeletal:        General: Tenderness present. No swelling or deformity. Normal range of motion.     Right lower leg: No edema.     Left lower leg: No edema.  Lymphadenopathy:     Cervical: No cervical adenopathy.     Lower Body: No right inguinal adenopathy. No  left inguinal adenopathy.  Skin:    General: Skin is warm and dry.     Findings: No rash.  Neurological:     Mental Status: She is alert and oriented to person, place, and time.     No results found for any visits on 06/28/22.    Assessment & Plan:    Problem List Items Addressed This Visit       Musculoskeletal and Integument   Rheumatoid arthritis (HCC)    Persistent myalgia, multiple joint pain, stiffness and fatigue. Has not been able to schedule appt with rheumatology due to lack of records from previous rheumatologist (requested  twice)  No joint redness or swelling or deformity noted today. Advised to to get records from previous rheumatologist. No improvement with meloxicam and reports nausea with med despite taking with food. Get arthritis panel today        Other   Menorrhagia with regular cycle   Relevant Orders   Iron, TIBC and Ferritin Panel   Other Visit Diagnoses     Acute vaginitis    -  Primary   Relevant Orders   Cervicovaginal ancillary only( [place])   Myalgia       Relevant Orders   ANA w/Reflex   C-reactive protein   Rheumatoid Arthritis Profile   Sedimentation rate   VITAMIN D 25 Hydroxy (Vit-D Deficiency, Fractures)   Pain in other joint       Relevant Orders   ANA w/Reflex   C-reactive protein   Rheumatoid Arthritis Profile   Sedimentation rate      Return if symptoms worsen or fail to improve.     Alysia Penna, NP

## 2022-06-28 NOTE — Assessment & Plan Note (Addendum)
Persistent myalgia, multiple joint pain, stiffness and fatigue. Has not been able to schedule appt with rheumatology due to lack of records from previous rheumatologist (requested twice)  No joint redness or swelling or deformity noted today. Advised to to get records from previous rheumatologist. No improvement with meloxicam and reports nausea with med despite taking with food. Get arthritis panel today

## 2022-06-29 ENCOUNTER — Encounter: Payer: Self-pay | Admitting: Nurse Practitioner

## 2022-06-29 DIAGNOSIS — M059 Rheumatoid arthritis with rheumatoid factor, unspecified: Secondary | ICD-10-CM

## 2022-06-29 LAB — CERVICOVAGINAL ANCILLARY ONLY
Bacterial Vaginitis (gardnerella): POSITIVE — AB
Candida Glabrata: NEGATIVE
Candida Vaginitis: NEGATIVE
Chlamydia: NEGATIVE
Comment: NEGATIVE
Comment: NEGATIVE
Comment: NEGATIVE
Comment: NEGATIVE
Comment: NEGATIVE
Comment: NORMAL
Neisseria Gonorrhea: NEGATIVE
Trichomonas: NEGATIVE

## 2022-06-29 LAB — IRON,TIBC AND FERRITIN PANEL
%SAT: 6 % (calc) — ABNORMAL LOW (ref 16–45)
Ferritin: 3 ng/mL — ABNORMAL LOW (ref 16–232)
Iron: 25 ug/dL — ABNORMAL LOW (ref 40–190)
TIBC: 413 mcg/dL (calc) (ref 250–450)

## 2022-06-29 MED ORDER — IRON (FERROUS SULFATE) 325 (65 FE) MG PO TABS: 325.0000 mg | ORAL_TABLET | Freq: Every day | ORAL | Status: DC

## 2022-06-29 MED ORDER — METRONIDAZOLE 0.75 % VA GEL: 1.0000 | Freq: Every day | VAGINAL | 0 refills | Status: DC

## 2022-06-29 NOTE — Addendum Note (Signed)
Addended by: Wilfred Lacy L on: 06/29/2022 03:03 PM   Modules accepted: Orders

## 2022-06-30 LAB — ANA W/REFLEX: Anti Nuclear Antibody (ANA): NEGATIVE

## 2022-06-30 LAB — RHEUMATOID ARTHRITIS PROFILE
Cyclic Citrullin Peptide Ab: 8 units (ref 0–19)
Rheumatoid fact SerPl-aCnc: 18.8 IU/mL — ABNORMAL HIGH (ref <14.0)

## 2022-07-08 ENCOUNTER — Ambulatory Visit: Payer: Medicaid Other | Admitting: Obstetrics and Gynecology

## 2022-07-13 ENCOUNTER — Other Ambulatory Visit (HOSPITAL_COMMUNITY)
Admission: RE | Admit: 2022-07-13 | Discharge: 2022-07-13 | Disposition: A | Discharge status: Home / Self Care | Payer: 59 | Source: Ambulatory Visit | Attending: Nurse Practitioner | Admitting: Nurse Practitioner

## 2022-07-13 ENCOUNTER — Encounter: Payer: Self-pay | Admitting: Nurse Practitioner

## 2022-07-13 ENCOUNTER — Ambulatory Visit (INDEPENDENT_AMBULATORY_CARE_PROVIDER_SITE_OTHER): Payer: 59 | Admitting: Nurse Practitioner

## 2022-07-13 VITALS — BP 104/72 | HR 74 | Temp 97.1°F | Ht 61.0 in | Wt 137.4 lb

## 2022-07-13 DIAGNOSIS — N898 Other specified noninflammatory disorders of vagina: Secondary | ICD-10-CM

## 2022-07-13 DIAGNOSIS — N760 Acute vaginitis: Secondary | ICD-10-CM | POA: Diagnosis not present

## 2022-07-13 DIAGNOSIS — B9689 Other specified bacterial agents as the cause of diseases classified elsewhere: Secondary | ICD-10-CM

## 2022-07-13 MED ORDER — MICONAZOLE NITRATE 2 % VA CREA: 1.0000 | TOPICAL_CREAM | Freq: Every day | VAGINAL | 0 refills | Status: DC

## 2022-07-13 NOTE — Patient Instructions (Signed)
Vaginitis  Vaginitis is irritation and swelling of the vagina. Treatment will depend on the cause. What are the causes? It can be caused by: Bacteria. Yeast. A parasite. A virus. Low hormone levels. Bubble baths, scented tampons, and feminine sprays. Other things can change the balance of the yeast and bacteria that live in the vagina. These include: Antibiotic medicines. Not being clean enough. Some birth control methods. Sex. Infection. Diabetes. A weakened body defense system (immune system). What increases the risk? Smoking or being around someone who smokes. Using washes (douches), scented tampons, or scented pads. Wearing tight pants or thong underwear. Using birth control pills or an IUD. Having sex without a condom or having a lot of partners. Having an STI. Using a certain product to kill sperm (nonoxynol-9). Eating foods that are high in sugar. Having diabetes. Having low levels of a female hormone. Having a weakened body defense system. Being pregnant or breastfeeding. What are the signs or symptoms? Fluid coming from the vagina that is not normal. A bad smell. Itching, pain, or swelling. Pain with sex. Pain or burning when you pee (urinate). Sometimes there are no symptoms. How is this treated? Treatment may include: Antibiotic creams or pills. Antifungal medicines. Medicines to ease symptoms if you have a virus. Your sex partner should also be treated. Estrogen medicines. Avoiding scented soaps, sprays, or douches. Stopping use of products that caused irritation and then using a cream to treat symptoms. Follow these instructions at home: Lifestyle Keep the area around your vagina clean and dry. Avoid using soap. Rinse the area with water. Until your doctor says it is okay: Do not use washes for the vagina. Do not use tampons. Do not have sex. Wipe from front to back after going to the bathroom. When your doctor says it is okay, practice safe sex  and use condoms. General instructions Take over-the-counter and prescription medicines only as told by your doctor. If you were prescribed an antibiotic medicine, take or use it as told by your doctor. Do not stop taking or using it even if you start to feel better. Keep all follow-up visits. How is this prevented? Do not use things that can irritate the vagina, such as fabric softeners. Avoid these products if they are scented: Sprays. Detergents. Tampons. Products for cleaning the vagina. Soaps or bubble baths. Let air reach your vagina. To do this: Wear cotton underwear. Do not wear: Underwear while you sleep. Tight pants. Thong underwear. Underwear or nylons without a cotton panel. Take off any wet clothing, such as bathing suits, as soon as you can. Practice safe sex and use condoms. Contact a doctor if: You have pain in your belly or in the area between your hips. You have a fever or chills. Your symptoms last for more than 2-3 days. Get help right away if: You have a fever and your symptoms get worse all of a sudden. Summary Vaginitis is irritation and swelling of the vagina. Treatment will depend on the cause of the condition. Do not use washes or tampons or have sex until your doctor says it is okay. This information is not intended to replace advice given to you by your health care provider. Make sure you discuss any questions you have with your health care provider. Document Revised: 12/06/2019 Document Reviewed: 12/06/2019 Elsevier Patient Education  [place].

## 2022-07-13 NOTE — Progress Notes (Signed)
   Acute Office Visit  Subjective:    Patient ID: Lindsey Carlson, female    DOB: 1980/07/10, 42 y.o.   MRN: [id number]  Chief Complaint  Patient presents with   Acute Visit    Vag itching/discomfort. Completed dose of BV meds, then it started.     HPI Patient is in today for itching in vaginal region after use of new panty liner. She denies any vaginal discharge or pelvic pain or dysuria.  Outpatient Medications Prior to Visit  Medication Sig   amLODipine (NORVASC) 10 MG tablet Take 1 tablet (10 mg total) by mouth daily.   Iron, Ferrous Sulfate, 325 (65 Fe) MG TABS Take 325 mg by mouth daily.   [DISCONTINUED] metroNIDAZOLE (METROGEL) 0.75 % vaginal gel Place 1 Applicatorful vaginally at bedtime. (Patient not taking: Reported on 07/13/2022)   No facility-administered medications prior to visit.    Reviewed past medical and social history.  Review of Systems Per HPI     Objective:    Physical Exam Vitals reviewed. Exam conducted with a chaperone present.  Genitourinary:    Labia:        Right: No tenderness.        Left: No tenderness.      Comments: Excoriation on bilateral labia. No lesion. Neurological:     Mental Status: She is alert.    BP 104/72 (BP Location: Right Arm, Patient Position: Sitting)   Pulse 74   Temp (!) 97.1 F (36.2 C) (Temporal)   Ht 5\' 1"  (1.549 m)   Wt 137 lb 6.4 oz (62.3 kg)   LMP 06/14/2022 (Exact Date)   SpO2 99%   BMI 25.96 kg/m    No results found for any visits on 07/13/22.     Assessment & Plan:   Problem List Items Addressed This Visit   None Visit Diagnoses     Itching in the vaginal area    -  Primary   Relevant Medications   miconazole (MONISTAT 7) 2 % vaginal cream   Other Relevant Orders   Cervicovaginal ancillary only( [place])      Meds ordered this encounter  Medications   miconazole (MONISTAT 7) 2 % vaginal cream    Sig: Place 1 Applicatorful vaginally at bedtime.    Dispense:  45 g    Refill:  0     Order Specific Question:   Supervising Provider    Answer:   Abelino Derrick ALFRED [[id number]]   No follow-ups on file.    Wilfred Lacy, NP

## 2022-07-14 LAB — CERVICOVAGINAL ANCILLARY ONLY
Bacterial Vaginitis (gardnerella): POSITIVE — AB
Candida Glabrata: NEGATIVE
Candida Vaginitis: NEGATIVE
Comment: NEGATIVE
Comment: NEGATIVE
Comment: NEGATIVE

## 2022-07-14 MED ORDER — TINIDAZOLE 500 MG PO TABS: 500.0000 mg | ORAL_TABLET | Freq: Every day | ORAL | 0 refills | Status: DC

## 2022-07-14 NOTE — Addendum Note (Signed)
Addended by: Leana Gamer on: 07/14/2022 01:42 PM   Modules accepted: Orders

## 2022-07-20 ENCOUNTER — Ambulatory Visit (INDEPENDENT_AMBULATORY_CARE_PROVIDER_SITE_OTHER): Payer: Medicaid Other | Admitting: Obstetrics and Gynecology

## 2022-07-20 ENCOUNTER — Telehealth: Payer: Self-pay

## 2022-07-20 VITALS — BP 115/76 | HR 80 | Ht 61.0 in | Wt 136.0 lb

## 2022-07-20 DIAGNOSIS — B9689 Other specified bacterial agents as the cause of diseases classified elsewhere: Secondary | ICD-10-CM

## 2022-07-20 DIAGNOSIS — N760 Acute vaginitis: Secondary | ICD-10-CM

## 2022-07-20 MED ORDER — FLUCONAZOLE 150 MG PO TABS: 150.0000 mg | ORAL_TABLET | Freq: Once | ORAL | 0 refills | Status: AC

## 2022-07-20 MED ORDER — TINIDAZOLE 500 MG PO TABS: 2.0000 g | ORAL_TABLET | Freq: Every day | ORAL | 0 refills | Status: AC

## 2022-07-20 NOTE — Telephone Encounter (Signed)
Left vm for pt to call office back w/ any further questions about today's appointment.

## 2022-07-20 NOTE — Progress Notes (Signed)
Obstetrics and Gynecology Visit Return Patient Evaluation  Appointment Date: 07/20/2022  Primary Care Provider: [name], [place] for [facility]  Chief Complaint: vaginitis  History of Present Illness:  Lindsey Carlson is a 42 y.o. seen by her PCP on 1/23 for vaginitis s/s. Patient initially prescribed monistat 7 and then swab results came back for BV only so tindamax sent in the next day. Patient states she took the tindamax and stopped the monistat 7; pt currently asymptomatic  Review of Systems:  as noted in the History of Present Illness.  Patient Active Problem List   Diagnosis Date Noted   Primary hypertension 05/10/2022   Palpitations 05/10/2022   Mass of upper outer quadrant of left breast 05/10/2022   Right ovarian cyst 09/03/2021   Elevated LDL cholesterol level 09/03/2021   Dysmenorrhea 07/28/2021   Dyspareunia in female 07/28/2021   Menorrhagia with regular cycle 07/28/2021   History of lupus anticoagulant disorder 07/28/2021   Other specified abnormal immunological findings in serum 06/23/2020   Rheumatoid arthritis ([place]) 06/23/2020   Genetic testing 06/01/2019   Family history of breast cancer    Breast tenderness in female 04/24/2019   Tonsillitis, chronic 09/04/2018   Medications:  [name] had no medications administered during this visit. Current Outpatient Medications  Medication Sig Dispense Refill   amLODipine (NORVASC) 10 MG tablet Take 1 tablet (10 mg total) by mouth daily. 90 tablet 0   fluconazole (DIFLUCAN) 150 MG tablet Take 1 tablet (150 mg total) by mouth once for 1 dose. Can take additional dose three days later if symptoms persist 2 tablet 0   Iron, Ferrous Sulfate, 325 (65 Fe) MG TABS Take 325 mg by mouth daily. 30 tablet    miconazole (MONISTAT 7) 2 % vaginal cream Place 1 Applicatorful vaginally at bedtime. (Patient not taking: Reported on 07/20/2022) 45 g 0   tinidazole (TINDAMAX) 500 MG tablet  Take 4 tablets (2,000 mg total) by mouth daily with breakfast for 2 days. For bacterial vaginosis 8 tablet 0   No current facility-administered medications for this visit.    Allergies: has No Known Allergies.  Physical Exam:  BP 115/76   Pulse 80   Ht 5\' 1"  (1.549 m)   Wt 136 lb (61.7 kg)   LMP 07/14/2022 (Exact Date)   BMI 25.70 kg/m  Body mass index is 25.7 kg/m. General appearance: Well nourished, well developed female in no acute distress.  Neuro/Psych:  Normal mood and affect.     Assessment: patient stable  Plan:  1. BV (bacterial vaginosis) Refills sent in case s/s come back in the future.  - tinidazole (TINDAMAX) 500 MG tablet; Take 4 tablets (2,000 mg total) by mouth daily with breakfast for 2 days. For bacterial vaginosis  Dispense: 8 tablet; Refill: 0  2. Acute vaginitis   RTC: PRN  Durene Romans MD Attending Center for [facility])

## 2022-07-20 NOTE — Progress Notes (Signed)
CC: Vaginal discharge   Yellowish  X Months  PCP gave monistant and Tindamax

## 2022-07-27 NOTE — Telephone Encounter (Signed)
Lvm requesting a call back to schedule new patient appointment with Dr. Kandice Moos for chest pain per Dr. Florene Glen. Please schedule to next available. Thanks.

## 2022-07-27 NOTE — Telephone Encounter (Signed)
Pt has dropped off her records from [facility], I have placed in Charlotte's folder upfront. She is wanting a referral to another rheumatologist.  Pt at [phone]

## 2022-08-03 NOTE — Telephone Encounter (Signed)
Lvm requesting a call back to schedule new patient appointment for Dr. Kandice Moos. (See below for details). Please schedule to next available.

## 2022-08-05 ENCOUNTER — Ambulatory Visit
Admission: RE | Admit: 2022-08-05 | Discharge: 2022-08-05 | Disposition: A | Discharge status: Home / Self Care | Payer: Medicaid Other | Source: Ambulatory Visit | Attending: Family Medicine | Admitting: Family Medicine

## 2022-08-05 ENCOUNTER — Ambulatory Visit: Payer: Commercial Managed Care - PPO

## 2022-08-05 DIAGNOSIS — N6321 Unspecified lump in the left breast, upper outer quadrant: Secondary | ICD-10-CM

## 2022-08-05 NOTE — Addendum Note (Signed)
Addended by: Leana Gamer on: 08/05/2022 04:33 PM   Modules accepted: Orders

## 2022-08-09 ENCOUNTER — Other Ambulatory Visit: Payer: Self-pay | Admitting: Family Medicine

## 2022-08-09 DIAGNOSIS — I10 Essential (primary) hypertension: Secondary | ICD-10-CM

## 2022-08-10 ENCOUNTER — Encounter: Payer: Self-pay | Admitting: Nurse Practitioner

## 2022-08-10 ENCOUNTER — Ambulatory Visit: Payer: Medicaid Other | Admitting: Nurse Practitioner

## 2022-08-10 ENCOUNTER — Other Ambulatory Visit: Payer: Self-pay | Admitting: Family Medicine

## 2022-08-10 DIAGNOSIS — I10 Essential (primary) hypertension: Secondary | ICD-10-CM

## 2022-08-10 MED ORDER — AMLODIPINE BESYLATE 10 MG PO TABS: 10.0000 mg | ORAL_TABLET | Freq: Every day | ORAL | 0 refills | Status: DC

## 2022-08-10 NOTE — Telephone Encounter (Signed)
Patient states: -She was unaware that [place] for 02/20 was needed to get meds refilled since she came in on 01/08 and 01/23.  - She is concerned since she only has 1 pill left   Informed patient that since those were visits for acute concerns only, they did not cover what was needed for med refill. Pt verbalized understanding. Please Advise.

## 2022-08-11 ENCOUNTER — Other Ambulatory Visit: Payer: Self-pay

## 2022-08-18 ENCOUNTER — Other Ambulatory Visit: Payer: Self-pay | Admitting: Nurse Practitioner

## 2022-08-18 DIAGNOSIS — I10 Essential (primary) hypertension: Secondary | ICD-10-CM

## 2022-08-24 ENCOUNTER — Encounter: Payer: Self-pay | Admitting: Nurse Practitioner

## 2022-08-24 ENCOUNTER — Ambulatory Visit (INDEPENDENT_AMBULATORY_CARE_PROVIDER_SITE_OTHER): Payer: 59 | Admitting: Nurse Practitioner

## 2022-08-24 VITALS — BP 110/76 | HR 76 | Temp 98.6°F | Resp 16 | Ht 61.0 in | Wt 140.2 lb

## 2022-08-24 DIAGNOSIS — I10 Essential (primary) hypertension: Secondary | ICD-10-CM

## 2022-08-24 DIAGNOSIS — E7800 Pure hypercholesterolemia, unspecified: Secondary | ICD-10-CM

## 2022-08-24 DIAGNOSIS — D500 Iron deficiency anemia secondary to blood loss (chronic): Secondary | ICD-10-CM

## 2022-08-24 MED ORDER — AMLODIPINE BESYLATE 10 MG PO TABS: 10.0000 mg | ORAL_TABLET | Freq: Every day | ORAL | 3 refills | Status: DC

## 2022-08-24 NOTE — Progress Notes (Signed)
Established Patient Visit  Patient: Lindsey Carlson   DOB: 03-May-1981   42 y.o. Female  MRN: [id number] Visit Date: 08/24/2022  Subjective:    Chief Complaint  Patient presents with   Medication Refill    Amlodipine   HPI Primary hypertension BP at goal with amlodipine BP Readings from Last 3 Encounters:  08/24/22 110/76  07/20/22 115/76  07/13/22 104/72    Med refill sent repeat CMP  Iron deficiency anemia due to chronic blood loss Unable to tolerate oral iron supplement daily: nausea and constipation Repeat cbc and iron panel  Continue iron supplement EOD  Elevated LDL cholesterol level Repeat lipid panel  Wt Readings from Last 3 Encounters:  08/24/22 140 lb 3.2 oz (63.6 kg)  07/20/22 136 lb (61.7 kg)  07/13/22 137 lb 6.4 oz (62.3 kg)    Reviewed medical, surgical, and social history today  Medications: Outpatient Medications Prior to Visit  Medication Sig   Iron, Ferrous Sulfate, 325 (65 Fe) MG TABS Take 325 mg by mouth daily.   [DISCONTINUED] amLODipine (NORVASC) 10 MG tablet TAKE 1 TABLET (10 MG TOTAL) BY MOUTH DAILY FOR 15 DOSES   [DISCONTINUED] meloxicam (MOBIC) 15 MG tablet Take 15 mg by mouth daily as needed.   [DISCONTINUED] miconazole (MONISTAT 7) 2 % vaginal cream Place 1 Applicatorful vaginally at bedtime. (Patient not taking: Reported on 07/20/2022)   No facility-administered medications prior to visit.   Reviewed past medical and social history.   ROS per HPI above      Objective:  BP 110/76 (BP Location: Left Arm, Patient Position: Sitting, Cuff Size: Normal)   Pulse 76   Temp 98.6 F (37 C) (Temporal)   Resp 16   Ht '5\' 1"'$  (1.549 m)   Wt 140 lb 3.2 oz (63.6 kg)   LMP 08/12/2022 (Approximate)   SpO2 99%   BMI 26.49 kg/m      Physical Exam Cardiovascular:     Rate and Rhythm: Normal rate and regular rhythm.     Pulses: Normal pulses.     Heart sounds: Normal heart sounds.  Pulmonary:     Effort: Pulmonary effort is  normal.     Breath sounds: Normal breath sounds.  Neurological:     Mental Status: She is alert and oriented to person, place, and time.     No results found for any visits on 08/24/22.    Assessment & Plan:    Problem List Items Addressed This Visit       Cardiovascular and Mediastinum   Primary hypertension - Primary    BP at goal with amlodipine BP Readings from Last 3 Encounters:  08/24/22 110/76  07/20/22 115/76  07/13/22 104/72    Med refill sent repeat CMP      Relevant Medications   amLODipine (NORVASC) 10 MG tablet   Other Relevant Orders   Comprehensive metabolic panel     Other   Elevated LDL cholesterol level    Repeat lipid panel      Relevant Orders   Comprehensive metabolic panel   Lipid panel   Iron deficiency anemia due to chronic blood loss    Unable to tolerate oral iron supplement daily: nausea and constipation Repeat cbc and iron panel  Continue iron supplement EOD      Relevant Orders   IBC + Ferritin   Return in about 6 months (around 02/24/2023) for CPE (fasting).  Wilfred Lacy, NP

## 2022-08-24 NOTE — Assessment & Plan Note (Signed)
Repeat lipid panel ?

## 2022-08-24 NOTE — Patient Instructions (Signed)
Schedule fasting lab appt. Need to be fasting 8hrs prior to blood draw. Ok to drink water and take BP meds. Continue Heart healthy diet and daily exercise.try tylenol '650mg'$  every 8-12hrs as needed for pain

## 2022-08-24 NOTE — Assessment & Plan Note (Signed)
Unable to tolerate oral iron supplement daily: nausea and constipation Repeat cbc and iron panel  Continue iron supplement EOD

## 2022-08-24 NOTE — Assessment & Plan Note (Signed)
BP at goal with amlodipine BP Readings from Last 3 Encounters:  08/24/22 110/76  07/20/22 115/76  07/13/22 104/72    Med refill sent repeat CMP

## 2022-09-03 ENCOUNTER — Ambulatory Visit: Payer: 59 | Admitting: Family Medicine

## 2022-09-03 ENCOUNTER — Encounter: Payer: Self-pay | Admitting: Family Medicine

## 2022-09-03 VITALS — BP 110/64 | HR 80 | Temp 98.2°F | Ht 61.0 in | Wt 141.4 lb

## 2022-09-03 DIAGNOSIS — B349 Viral infection, unspecified: Secondary | ICD-10-CM | POA: Diagnosis not present

## 2022-09-03 LAB — POCT INFLUENZA A/B
Influenza A, POC: NEGATIVE
Influenza B, POC: NEGATIVE

## 2022-09-03 LAB — POC COVID19 BINAXNOW: SARS Coronavirus 2 Ag: NEGATIVE

## 2022-09-03 MED ORDER — PROMETHAZINE-DM 6.25-15 MG/5ML PO SYRP: 5.0000 mL | ORAL_SOLUTION | Freq: Four times a day (QID) | ORAL | 0 refills | Status: DC | PRN

## 2022-09-03 NOTE — Progress Notes (Signed)
Established Patient Office Visit   Subjective:  Patient ID: Lindsey Carlson, female    DOB: 10/06/1980  Age: 42 y.o. MRN: [id number]  Chief Complaint  Patient presents with   Cough    Cough, chest congestion, sore throat, headache and fever symptoms x 4 days.     Cough Associated symptoms include headaches and myalgias. Pertinent negatives include no eye redness, rash, shortness of breath or wheezing.   Encounter Diagnoses  Name Primary?   Viral syndrome Yes   4 to 5-day history of headache, malaise, fatigue, congestion, cough myalgias and arthralgias.  Denies wheezing or difficulty breathing.  There has been some nausea and vomiting.  No asthma history no tobacco use.  Coworkers have been sick.   Review of Systems  Constitutional:  Positive for malaise/fatigue.  HENT:  Positive for congestion.   Eyes:  Negative for blurred vision, discharge and redness.  Respiratory:  Positive for cough. Negative for shortness of breath and wheezing.   Cardiovascular: Negative.   Gastrointestinal:  Negative for abdominal pain.  Genitourinary: Negative.   Musculoskeletal:  Positive for joint pain and myalgias.  Skin:  Negative for rash.  Neurological:  Positive for headaches. Negative for tingling, loss of consciousness and weakness.  Endo/Heme/Allergies:  Negative for polydipsia.     Current Outpatient Medications:    amLODipine (NORVASC) 10 MG tablet, Take 1 tablet (10 mg total) by mouth daily., Disp: 90 tablet, Rfl: 3   Iron, Ferrous Sulfate, 325 (65 Fe) MG TABS, Take 325 mg by mouth daily., Disp: 30 tablet, Rfl:    promethazine-dextromethorphan (PROMETHAZINE-DM) 6.25-15 MG/5ML syrup, Take 5 mLs by mouth 4 (four) times daily as needed for cough. Drowsy precautions, Disp: 118 mL, Rfl: 0   Objective:     BP 110/64 (BP Location: Left Arm, Patient Position: Sitting, Cuff Size: Normal)   Pulse 80   Temp 98.2 F (36.8 C) (Temporal)   Ht 5\' 1"  (1.549 m)   Wt 141 lb 6.4 oz (64.1 kg)   LMP  08/12/2022 (Approximate)   SpO2 99%   BMI 26.72 kg/m    Physical Exam Constitutional:      General: She is not in acute distress.    Appearance: Normal appearance. She is not ill-appearing, toxic-appearing or diaphoretic.  HENT:     Head: Normocephalic and atraumatic.     Right Ear: Tympanic membrane, ear canal and external ear normal.     Left Ear: Tympanic membrane, ear canal and external ear normal.     Mouth/Throat:     Mouth: Mucous membranes are moist.     Pharynx: Oropharynx is clear. No oropharyngeal exudate or posterior oropharyngeal erythema.  Eyes:     General: No scleral icterus.       Right eye: No discharge.        Left eye: No discharge.     Extraocular Movements: Extraocular movements intact.     Conjunctiva/sclera: Conjunctivae normal.     Pupils: Pupils are equal, round, and reactive to light.  Cardiovascular:     Rate and Rhythm: Normal rate and regular rhythm.  Pulmonary:     Effort: Pulmonary effort is normal. No respiratory distress.     Breath sounds: Normal breath sounds. No wheezing or rales.  Musculoskeletal:     Cervical back: No rigidity or tenderness.  Skin:    General: Skin is warm and dry.  Neurological:     Mental Status: She is alert and oriented to person, place, and time.  Psychiatric:  Mood and Affect: Mood normal.        Behavior: Behavior normal.      No results found for any visits on 09/03/22.    The 10-year ASCVD risk score (Arnett DK, et al., 2019) is: 0.6%    Assessment & Plan:   Viral syndrome -     Promethazine-DM; Take 5 mLs by mouth 4 (four) times daily as needed for cough. Drowsy precautions  Dispense: 118 mL; Refill: 0    Return if symptoms worsen or fail to improve.    Libby Maw, MD

## 2022-09-16 ENCOUNTER — Ambulatory Visit
Admission: RE | Admit: 2022-09-16 | Discharge: 2022-09-16 | Disposition: A | Discharge status: Home / Self Care | Payer: 59 | Source: Ambulatory Visit | Attending: Internal Medicine | Admitting: Internal Medicine

## 2022-09-16 VITALS — BP 115/76 | HR 82 | Temp 97.9°F | Resp 19

## 2022-09-16 DIAGNOSIS — J029 Acute pharyngitis, unspecified: Secondary | ICD-10-CM

## 2022-09-16 DIAGNOSIS — R1310 Dysphagia, unspecified: Secondary | ICD-10-CM

## 2022-09-16 LAB — POCT RAPID STREP A (OFFICE): Rapid Strep A Screen: NEGATIVE

## 2022-09-16 MED ORDER — LIDOCAINE VISCOUS HCL 2 % MT SOLN: 10.0000 mL | Freq: Four times a day (QID) | OROMUCOSAL | 1 refills | Status: DC

## 2022-09-16 NOTE — ED Triage Notes (Signed)
Pt presents to [facility] with co of sore throat and swollen tonsils since yesterday morning. Pt reports she recently had the flu last week. Pt reports she has been taking theraflu and cough drops that numbs the area.

## 2022-09-16 NOTE — Discharge Instructions (Addendum)
Advised to use the Magic mouthwash, 1 to 2 teaspoons gargle 60 seconds then swallow, do this 4 times a day to help relieve the pain from the sore throat. (Be cautious with this medication because it does cause the throat to be numb for 5 to 10 minutes after use)  Advised take Tylenol or ibuprofen for pain relief.  Advised follow-up PCP return to urgent care as needed.
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
## 2022-09-24 ENCOUNTER — Other Ambulatory Visit (INDEPENDENT_AMBULATORY_CARE_PROVIDER_SITE_OTHER): Payer: 59

## 2022-09-24 ENCOUNTER — Encounter: Payer: Self-pay | Admitting: Nurse Practitioner

## 2022-09-24 DIAGNOSIS — E7800 Pure hypercholesterolemia, unspecified: Secondary | ICD-10-CM | POA: Diagnosis not present

## 2022-09-24 DIAGNOSIS — I10 Essential (primary) hypertension: Secondary | ICD-10-CM | POA: Diagnosis not present

## 2022-09-24 DIAGNOSIS — D500 Iron deficiency anemia secondary to blood loss (chronic): Secondary | ICD-10-CM | POA: Diagnosis not present

## 2022-09-24 LAB — COMPREHENSIVE METABOLIC PANEL
ALT: 13 U/L (ref 0–35)
AST: 16 U/L (ref 0–37)
Albumin: 4.3 g/dL (ref 3.5–5.2)
Alkaline Phosphatase: 76 U/L (ref 39–117)
BUN: 9 mg/dL (ref 6–23)
CO2: 24 mEq/L (ref 19–32)
Calcium: 9.4 mg/dL (ref 8.4–10.5)
Chloride: 104 mEq/L (ref 96–112)
Creatinine, Ser: 0.63 mg/dL (ref 0.40–1.20)
GFR: 108.65 mL/min (ref >60.00)
Glucose, Bld: 91 mg/dL (ref 70–99)
Potassium: 3.9 mEq/L (ref 3.5–5.1)
Sodium: 136 mEq/L (ref 135–145)
Total Bilirubin: 0.2 mg/dL (ref 0.2–1.2)
Total Protein: 7.7 g/dL (ref 6.0–8.3)

## 2022-09-24 LAB — LIPID PANEL
Cholesterol: 203 mg/dL — ABNORMAL HIGH (ref 0–200)
HDL: 56.3 mg/dL (ref >39.00)
LDL Cholesterol: 124 mg/dL — ABNORMAL HIGH (ref 0–99)
NonHDL: 147.04
Total CHOL/HDL Ratio: 4
Triglycerides: 117 mg/dL (ref 0.0–149.0)
VLDL: 23.4 mg/dL (ref 0.0–40.0)

## 2022-09-24 LAB — IBC + FERRITIN
Ferritin: 4.8 ng/mL — ABNORMAL LOW (ref 10.0–291.0)
Iron: 21 ug/dL — ABNORMAL LOW (ref 42–145)
Saturation Ratios: 4.4 % — ABNORMAL LOW (ref 20.0–50.0)
TIBC: 480.2 ug/dL — ABNORMAL HIGH (ref 250.0–450.0)
Transferrin: 343 mg/dL (ref 212.0–360.0)

## 2022-09-24 NOTE — Addendum Note (Signed)
Addended by: Alysia Penna L on: 09/24/2022 03:11 PM   Modules accepted: Orders

## 2022-09-27 ENCOUNTER — Telehealth: Payer: Self-pay | Admitting: Hematology and Oncology

## 2022-09-27 NOTE — Telephone Encounter (Signed)
scheduled per 4/8 referral, pt has been called and confirmed date and time. Pt is aware of location and to arrive early for check in

## 2022-10-18 ENCOUNTER — Inpatient Hospital Stay: Payer: 59 | Attending: Hematology and Oncology | Admitting: Hematology and Oncology

## 2022-10-18 ENCOUNTER — Other Ambulatory Visit: Payer: Self-pay

## 2022-10-18 ENCOUNTER — Inpatient Hospital Stay: Payer: 59

## 2022-10-18 ENCOUNTER — Encounter: Payer: Self-pay | Admitting: Hematology and Oncology

## 2022-10-18 VITALS — BP 128/76 | HR 78 | Temp 99.2°F | Resp 18 | Ht 61.0 in | Wt 137.0 lb

## 2022-10-18 DIAGNOSIS — Z803 Family history of malignant neoplasm of breast: Secondary | ICD-10-CM | POA: Diagnosis not present

## 2022-10-18 DIAGNOSIS — D500 Iron deficiency anemia secondary to blood loss (chronic): Secondary | ICD-10-CM

## 2022-10-18 DIAGNOSIS — N920 Excessive and frequent menstruation with regular cycle: Secondary | ICD-10-CM

## 2022-10-18 DIAGNOSIS — Z79899 Other long term (current) drug therapy: Secondary | ICD-10-CM | POA: Diagnosis not present

## 2022-10-18 DIAGNOSIS — Z832 Family history of diseases of the blood and blood-forming organs and certain disorders involving the immune mechanism: Secondary | ICD-10-CM

## 2022-10-18 DIAGNOSIS — M069 Rheumatoid arthritis, unspecified: Secondary | ICD-10-CM | POA: Diagnosis not present

## 2022-10-18 LAB — CBC WITH DIFFERENTIAL (CANCER CENTER ONLY)
Abs Immature Granulocytes: 0.01 10*3/uL (ref 0.00–0.07)
Basophils Absolute: 0 10*3/uL (ref 0.0–0.1)
Basophils Relative: 1 %
Eosinophils Absolute: 0.1 10*3/uL (ref 0.0–0.5)
Eosinophils Relative: 1 %
HCT: 37.6 % (ref 36.0–46.0)
Hemoglobin: 11.9 g/dL — ABNORMAL LOW (ref 12.0–15.0)
Immature Granulocytes: 0 %
Lymphocytes Relative: 47 %
Lymphs Abs: 2.3 10*3/uL (ref 0.7–4.0)
MCH: 25.5 pg — ABNORMAL LOW (ref 26.0–34.0)
MCHC: 31.6 g/dL (ref 30.0–36.0)
MCV: 80.5 fL (ref 80.0–100.0)
Monocytes Absolute: 0.3 10*3/uL (ref 0.1–1.0)
Monocytes Relative: 7 %
Neutro Abs: 2.1 10*3/uL (ref 1.7–7.7)
Neutrophils Relative %: 44 %
Platelet Count: 290 10*3/uL (ref 150–400)
RBC: 4.67 MIL/uL (ref 3.87–5.11)
RDW: 17.1 % — ABNORMAL HIGH (ref 11.5–15.5)
WBC Count: 4.8 10*3/uL (ref 4.0–10.5)
nRBC: 0 % (ref 0.0–0.2)

## 2022-10-18 LAB — PROTIME-INR
INR: 1 (ref 0.8–1.2)
Prothrombin Time: 13.4 seconds (ref 11.4–15.2)

## 2022-10-18 LAB — VITAMIN B12: Vitamin B-12: 330 pg/mL (ref 180–914)

## 2022-10-18 LAB — SEDIMENTATION RATE: Sed Rate: 14 mm/hr (ref 0–22)

## 2022-10-18 LAB — APTT: aPTT: 35 seconds (ref 24–36)

## 2022-10-18 NOTE — Assessment & Plan Note (Signed)
She is not being treated for active rheumatoid arthritis I advocate for the patient to continue on vitamin D supplement Active arthritis could cause anemia chronic illness

## 2022-10-18 NOTE — Assessment & Plan Note (Signed)
The most likely cause of her anemia is due to chronic blood loss/malabsorption syndrome. We discussed some of the risks, benefits, and alternatives of intravenous iron infusions. The patient is symptomatic from anemia and the iron level is critically low. She tolerated oral iron supplement poorly and desires to achieved higher levels of iron faster for adequate hematopoesis. Some of the side-effects to be expected including risks of infusion reactions, phlebitis, headaches, nausea and fatigue.  The patient is willing to proceed. Patient education material was dispensed.  Goal is to keep ferritin level greater than 50 and resolution of anemia  In addition, I will also be prescribing Lupron to stop her menstrual cycle I plan to see her again in a few months for further follow-up
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
## 2022-10-18 NOTE — Assessment & Plan Note (Signed)
She has a family history of von Willebrand disease Her daughter was diagnosed Given her heavy menorrhagia throughout her life, I suspect she could have von Willebrand's disease With her permission, I will order some coag study and von Willebrand panel and we will call her with test results  Her previous pelvic ultrasound showed evidence of fibroid I recommend Lupron injection that would also help regulate her menstrual cycle and shrink the fibroid The risk and benefits were fully discussed and she is in agreement

## 2022-10-19 LAB — VON WILLEBRAND PANEL
Coagulation Factor VIII: 71 % (ref 56–140)
Ristocetin Co-factor, Plasma: 79 % (ref 50–200)
Von Willebrand Antigen, Plasma: 100 % (ref 50–200)

## 2022-10-19 LAB — COAG STUDIES INTERP REPORT

## 2022-10-25 ENCOUNTER — Ambulatory Visit (INDEPENDENT_AMBULATORY_CARE_PROVIDER_SITE_OTHER): Payer: 59

## 2022-10-25 ENCOUNTER — Ambulatory Visit: Payer: 59

## 2022-10-25 ENCOUNTER — Ambulatory Visit
Admission: EM | Admit: 2022-10-25 | Discharge: 2022-10-25 | Disposition: A | Discharge status: Home / Self Care | Payer: 59 | Attending: Internal Medicine | Admitting: Internal Medicine

## 2022-10-25 DIAGNOSIS — S63502A Unspecified sprain of left wrist, initial encounter: Secondary | ICD-10-CM

## 2022-10-25 HISTORY — DX: Essential (primary) hypertension: I10

## 2022-10-25 NOTE — ED Triage Notes (Signed)
Pt c/o housekeeping injury where a luggage cart came down injury her left wrist.   Onset ~ today

## 2022-10-25 NOTE — Discharge Instructions (Signed)
Your x-rays of your wrist were negative for fracture or dislocation. You likely sprained your wrist.   Wear the wrist brace we provided in the clinic for the next couple of weeks to provide compression, stability, and comfort.  Please rest, ice, and elevate your wrist to help it heal and decrease inflammation.   Take 600mg ibuprofen and/or 1,000mg tylenol every 6 hours as needed for pain and inflammation. Take with food to avoid stomach upset.  Call the orthopedic provider listed on your discharge paperwork to schedule a follow-up appointment if your symptoms do not improve in the next 1-2 weeks with supportive care.  Return to urgent care if you experience worsening pain, numbness, tingling, change of color in your skin near the injury, or any other concerning symptoms.  I hope you feel better!  
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
## 2022-10-28 ENCOUNTER — Other Ambulatory Visit: Payer: Self-pay

## 2022-10-28 ENCOUNTER — Inpatient Hospital Stay: Payer: 59 | Attending: Hematology and Oncology

## 2022-10-28 VITALS — BP 110/79 | HR 80 | Temp 98.2°F | Resp 16

## 2022-10-28 DIAGNOSIS — D500 Iron deficiency anemia secondary to blood loss (chronic): Secondary | ICD-10-CM | POA: Insufficient documentation

## 2022-10-28 DIAGNOSIS — N920 Excessive and frequent menstruation with regular cycle: Secondary | ICD-10-CM | POA: Insufficient documentation

## 2022-10-28 DIAGNOSIS — R110 Nausea: Secondary | ICD-10-CM

## 2022-10-28 MED ORDER — LEUPROLIDE ACETATE (3 MONTH) 11.25 MG IM KIT
11.2500 mg | PACK | Freq: Once | INTRAMUSCULAR | Status: DC
  Filled 2022-10-28: qty 11.25

## 2022-10-28 MED ORDER — SODIUM CHLORIDE 0.9 % IV SOLN: Freq: Once | INTRAVENOUS | Status: AC

## 2022-10-28 MED ORDER — ONDANSETRON HCL 4 MG/2ML IJ SOLN
4.0000 mg | Freq: Once | INTRAMUSCULAR | Status: AC
  Administered 2022-10-28: 4 mg via INTRAVENOUS

## 2022-10-28 MED ORDER — SODIUM CHLORIDE 0.9 % IV SOLN
400.0000 mg | Freq: Once | INTRAVENOUS | Status: AC
  Administered 2022-10-28: 400 mg via INTRAVENOUS
  Filled 2022-10-28: qty 20

## 2022-10-28 NOTE — Progress Notes (Signed)
Patient awaited 30 minutes post iron infusion with no issues. 1 hour and 30 minutes into treatment patient had some mild nausea. Gave a fluid bolus over 30 minutes pr dr. Bertis Ruddy and gave 4 mg iv Zofran. Patient did not have any other complications. Pryor Ochoa worked last night and did not have any food to eat to today. Gave nourishment. Patient finished treatment with no issues. Vss upon discharge.
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
## 2022-11-16 ENCOUNTER — Ambulatory Visit: Payer: 59 | Attending: Internal Medicine | Admitting: Internal Medicine

## 2022-11-16 ENCOUNTER — Encounter: Payer: Self-pay | Admitting: Internal Medicine

## 2022-11-16 VITALS — BP 103/70 | HR 69 | Resp 12 | Ht 61.0 in | Wt 134.0 lb

## 2022-11-16 DIAGNOSIS — M069 Rheumatoid arthritis, unspecified: Secondary | ICD-10-CM | POA: Diagnosis not present

## 2022-11-16 DIAGNOSIS — D500 Iron deficiency anemia secondary to blood loss (chronic): Secondary | ICD-10-CM

## 2022-11-16 LAB — SEDIMENTATION RATE: Sed Rate: 14 mm/h (ref 0–20)

## 2022-11-16 NOTE — Patient Instructions (Signed)
Hydroxychloroquine Tablets What is this medication? HYDROXYCHLOROQUINE (hye drox ee KLOR oh kwin) treats autoimmune conditions, such as rheumatoid arthritis and lupus. It works by slowing down an overactive immune system. It may also be used to prevent and treat malaria. It works by killing the parasite that causes malaria. It belongs to a group of medications called DMARDs. This medicine may be used for other purposes; ask your health care provider or pharmacist if you have questions. COMMON BRAND NAME(S): Plaquenil, Quineprox What should I tell my care team before I take this medication? They need to know if you have any of these conditions: Diabetes Eye disease, vision problems Frequently drink alcohol G6PD deficiency Heart disease Irregular heartbeat or rhythm Kidney disease Liver disease Porphyria Psoriasis An unusual or allergic reaction to hydroxychloroquine, other medications, foods, dyes, or preservatives Pregnant or trying to get pregnant Breastfeeding How should I use this medication? Take this medication by mouth with water. Take it as directed on the prescription label. Do not cut, crush, or chew this medication. Swallow the tablets whole. Take it with food. Do not take it more than directed. Take all of this medication unless your care team tells you to stop it early. Keep taking it even if you think you are better. Take products with antacids in them at a different time of day than this medication. Take this medication 4 hours before or 4 hours after antacids. Talk to your care team if you have questions. Talk to your care team about the use of this medication in children. While this medication may be prescribed for selected conditions, precautions do apply. Overdosage: If you think you have taken too much of this medicine contact a poison control center or emergency room at once. NOTE: This medicine is only for you. Do not share this medicine with others. What if I miss a  dose? If you miss a dose, take it as soon as you can. If it is almost time for your next dose, take only that dose. Do not take double or extra doses. What may interact with this medication? Do not take this medication with any of the following: Cisapride Dronedarone Pimozide Thioridazine This medication may also interact with the following: Ampicillin Antacids Cimetidine Cyclosporine Digoxin Kaolin Medications for diabetes, such as insulin, glipizide, glyburide Medications for seizures, such as carbamazepine, phenobarbital, phenytoin Mefloquine Methotrexate Other medications that cause heart rhythm changes Praziquantel This list may not describe all possible interactions. Give your health care provider a list of all the medicines, herbs, non-prescription drugs, or dietary supplements you use. Also tell them if you smoke, drink alcohol, or use illegal drugs. Some items may interact with your medicine. What should I watch for while using this medication? Visit your care team for regular checks on your progress. Tell your care team if your symptoms do not start to get better or if they get worse. You may need blood work done while you are taking this medication. If you take other medications that can affect heart rhythm, you may need more testing. Talk to your care team if you have questions. Your vision may be tested before and during use of this medication. Tell your care team right away if you have any change in your eyesight. This medication may cause serious skin reactions. They can happen weeks to months after starting the medication. Contact your care team right away if you notice fevers or flu-like symptoms with a rash. The rash may be red or purple and then turn  into blisters or peeling of the skin. Or, you might notice a red rash with swelling of the face, lips or lymph nodes in your neck or under your arms. If you or your family notice any changes in your behavior, such as new or  worsening depression, thoughts of harming yourself, anxiety, or other unusual or disturbing thoughts, or memory loss, call your care team right away. What side effects may I notice from receiving this medication? Side effects that you should report to your care team as soon as possible: Allergic reactions--skin rash, itching, hives, swelling of the face, lips, tongue, or throat Aplastic anemia--unusual weakness or fatigue, dizziness, headache, trouble breathing, increased bleeding or bruising Change in vision Heart rhythm changes--fast or irregular heartbeat, dizziness, feeling faint or lightheaded, chest pain, trouble breathing Infection--fever, chills, cough, or sore throat Low blood sugar (hypoglycemia)--tremors or shaking, anxiety, sweating, cold or clammy skin, confusion, dizziness, rapid heartbeat Muscle injury--unusual weakness or fatigue, muscle pain, dark yellow or brown urine, decrease in amount of urine Pain, tingling, or numbness in the hands or feet Rash, fever, and swollen lymph nodes Redness, blistering, peeling, or loosening of the skin, including inside the mouth Thoughts of suicide or self-harm, worsening mood, or feelings of depression Unusual bruising or bleeding Side effects that usually do not require medical attention (report to your care team if they continue or are bothersome): Diarrhea Headache Nausea Stomach pain Vomiting This list may not describe all possible side effects. Call your doctor for medical advice about side effects. You may report side effects to FDA at 1-800-FDA-1088. Where should I keep my medication? Keep out of the reach of children and pets. Store at room temperature up to 30 degrees C (86 degrees F). Protect from light. Get rid of any unused medication after the expiration date. To get rid of medications that are no longer needed or have expired: Take the medication to a medication take-back program. Check with your pharmacy or law enforcement  to find a location. If you cannot return the medication, check the label or package insert to see if the medication should be thrown out in the garbage or flushed down the toilet. If you are not sure, ask your care team. If it is safe to put it in the trash, empty the medication out of the container. Mix the medication with cat litter, dirt, coffee grounds, or other unwanted substance. Seal the mixture in a bag or container. Put it in the trash. NOTE: This sheet is a summary. It may not cover all possible information. If you have questions about this medicine, talk to your doctor, pharmacist, or health care provider.  2024 Elsevier/Gold Standard (2021-12-14 00:00:00)

## 2022-11-17 LAB — C-REACTIVE PROTEIN: CRP: <3 mg/L (ref <8.0)

## 2022-11-21 LAB — MUTATED CITRULLINATED VIMENTIN (MCV) ANTIBODY: MUTATED CITRULLINATED VIMENTIN (MCV) AB: <20 U/mL (ref <20)

## 2022-11-21 MED ORDER — HYDROXYCHLOROQUINE SULFATE 200 MG PO TABS
200.0000 mg | ORAL_TABLET | Freq: Every day | ORAL | 2 refills | Status: DC
Start: 2022-11-21 — End: 2023-08-26

## 2022-11-21 NOTE — Addendum Note (Signed)
Addended by: Fuller Plan on: 11/21/2022 09:54 PM   Modules accepted: Orders

## 2022-11-21 NOTE — Progress Notes (Signed)
Sed rate and CRP are normal these were previously elevated, so not sure if inflammation is really causing her joint pains at present. The RA antibody test was also negative. I recommend starting hydroxychloroquine 200 mg daily as discussed for possible seronegative rheumatoid arthritis. We should schedule follow up in about 2 months to see how much this treatment is helping or not.

## 2022-11-22 ENCOUNTER — Telehealth: Payer: Self-pay

## 2022-11-22 NOTE — Telephone Encounter (Signed)
Patient contacted the office and states she has large black and blue marks and bruises where her blood was drawn for her labs at her last appointment on 11/16/2022. Patient states she is concerned about them. Patient states she is going to upload photos on her MyChart account. Patient call back number is [phone]. Please advise.

## 2022-11-24 ENCOUNTER — Encounter: Payer: Self-pay | Admitting: Internal Medicine

## 2022-11-24 NOTE — Telephone Encounter (Signed)
Patient advised per Dr.Rice The reported symptom sounds like bruising or a possible hematoma, which is from some blood leaking out of the punctured vein. Not a serious problem unless there is a bleeding disorder. Treatment is usually conservative can just do cold compress treatment and the area should resolve over the course of multiple days. If swelling and involved area keeps getting bigger over time could be something to take a look at. Dr. Dimple Casey does not see any picture on the mychart so if she did try to upload these it did not go through.  Patient states she will try to upload the photos again. Patient states the area looks better today and is not swollen. Patient states previously the area was very dark.

## 2022-11-24 NOTE — Telephone Encounter (Signed)
Attempted to contact the patient and left a message to call the office back. 

## 2022-11-24 NOTE — Telephone Encounter (Signed)
Attempted to return call left VM to call back or we can reach out again later. The reported symptom sounds like bruising or a possible hematoma, which is from some blood leaking out of the punctured vein. Not a serious problem unless there is a bleeding disorder. Treatment is usually conservative can just do cold compress treatment and the area should resolve over the course of multiple days. If swelling and involved area keeps getting bigger over time could be something to take a look at. I do not see any picture on the mychart so if she did try to upload these it did not go through.

## 2023-01-18 ENCOUNTER — Encounter: Payer: Self-pay | Admitting: Hematology and Oncology

## 2023-01-18 ENCOUNTER — Inpatient Hospital Stay: Payer: 59

## 2023-01-18 ENCOUNTER — Other Ambulatory Visit: Payer: Self-pay

## 2023-01-18 ENCOUNTER — Inpatient Hospital Stay: Payer: 59 | Attending: Hematology and Oncology | Admitting: Hematology and Oncology

## 2023-01-18 VITALS — BP 109/62 | HR 75 | Temp 98.9°F | Resp 18 | Ht 61.0 in

## 2023-01-18 DIAGNOSIS — D500 Iron deficiency anemia secondary to blood loss (chronic): Secondary | ICD-10-CM

## 2023-01-18 DIAGNOSIS — N920 Excessive and frequent menstruation with regular cycle: Secondary | ICD-10-CM

## 2023-01-18 DIAGNOSIS — Z832 Family history of diseases of the blood and blood-forming organs and certain disorders involving the immune mechanism: Secondary | ICD-10-CM

## 2023-01-18 LAB — CBC WITH DIFFERENTIAL (CANCER CENTER ONLY)
Abs Immature Granulocytes: 0.02 10*3/uL (ref 0.00–0.07)
Basophils Absolute: 0 10*3/uL (ref 0.0–0.1)
Basophils Relative: 1 %
Eosinophils Absolute: 0.1 10*3/uL (ref 0.0–0.5)
Eosinophils Relative: 2 %
HCT: 37.9 % (ref 36.0–46.0)
Hemoglobin: 12.6 g/dL (ref 12.0–15.0)
Immature Granulocytes: 0 %
Lymphocytes Relative: 32 %
Lymphs Abs: 1.8 10*3/uL (ref 0.7–4.0)
MCH: 28.1 pg (ref 26.0–34.0)
MCHC: 33.2 g/dL (ref 30.0–36.0)
MCV: 84.6 fL (ref 80.0–100.0)
Monocytes Absolute: 0.4 10*3/uL (ref 0.1–1.0)
Monocytes Relative: 8 %
Neutro Abs: 3.2 10*3/uL (ref 1.7–7.7)
Neutrophils Relative %: 57 %
Platelet Count: 287 10*3/uL (ref 150–400)
RBC: 4.48 MIL/uL (ref 3.87–5.11)
RDW: 13.3 % (ref 11.5–15.5)
WBC Count: 5.5 10*3/uL (ref 4.0–10.5)
nRBC: 0 % (ref 0.0–0.2)

## 2023-01-18 LAB — IRON AND IRON BINDING CAPACITY (CC-WL,HP ONLY)
Iron: 60 ug/dL (ref 28–170)
Saturation Ratios: 15 % (ref 10.4–31.8)
TIBC: 406 ug/dL (ref 250–450)
UIBC: 346 ug/dL (ref 148–442)

## 2023-01-18 LAB — FERRITIN: Ferritin: 13 ng/mL (ref 11–307)

## 2023-01-18 NOTE — Assessment & Plan Note (Signed)
I have ordered blood work to screen for von Willebrand disease and they came back normal

## 2023-01-18 NOTE — Assessment & Plan Note (Signed)
She responded well to intravenous iron infusion Previously, we discussed the role of Lupron injection but the patient decline For future follow-up, I recommend repeat blood work and iron studies once or twice a year with her primary care doctor She will call me if she needs repeat intravenous iron infusion again

## 2023-01-18 NOTE — Progress Notes (Signed)
[facility] OFFICE PROGRESS NOTE  Lindsey Carlson, Lindsey Gains, NP  ASSESSMENT & PLAN:  Iron deficiency anemia due to chronic blood loss She responded well to intravenous iron infusion Previously, we discussed the role of Lupron injection but the patient decline For future follow-up, I recommend repeat blood work and iron studies once or twice a year with her primary care doctor She will call me if she needs repeat intravenous iron infusion again  Menorrhagia with regular cycle I have ordered blood work to screen for von Willebrand disease and they came back normal  No orders of the defined types were placed in this encounter.   The total time spent in the appointment was 20 minutes encounter with patients including review of chart and various tests results, discussions about plan of care and coordination of care plan   All questions were answered. The patient knows to call the clinic with any problems, questions or concerns. No barriers to learning was detected.    Lindsey Delay, MD 7/30/202410:57 AM  INTERVAL HISTORY: Lindsey Carlson 42 y.o. female returns for further follow-up for iron deficiency anemia We discussed test results and future follow-up She has declined Lupron injections  SUMMARY OF HEMATOLOGIC HISTORY:   She was found to have abnormal CBC from recent blood draw I have had opportunity to review his CBC dated back to many years In the past, she is noted to have iron deficiency anemia Most recently, her doctor ordered an iron panel but did not see a CBC drawn until I ordered it today She stated she has been anemic her whole life She denies recent chest pain on exertion, shortness of breath on minimal exertion, pre-syncopal episodes, or palpitations. She does complain of excessive fatigue and intermittent leg cramps She had not noticed any recent bleeding such as epistaxis, hematuria or hematochezia The patient denies over the counter NSAID ingestion. She is  not on antiplatelets agents. She has not have EGD or colonoscopy evaluation in the past She had been diagnosed with rheumatoid arthritis but is not receiving active treatment She had no prior history or diagnosis of cancer. Her age appropriate screening programs are up-to-date. She has some pica and eats a variety of diet. She never donated blood or received blood transfusion The patient was prescribed oral iron supplements and she takes daily for many years.  She also had history of B12 deficiency and had been on B12 injection for some time but none recently Interestingly, her daughter with heavy menorrhagia was diagnosed with von Willebrand disease but she has never been tested She had multiple surgeries in the past but never required transfusion support after surgery She had excessive menstrual bleeding every month, on average, her menstrual cycle last 7 days associated with significant bleeding. She received 1 dose of intravenous iron in May 2024  I have reviewed the past medical history, past surgical history, social history and family history with the patient and they are unchanged from previous note.  ALLERGIES:  has No Known Allergies.  MEDICATIONS:  Current Outpatient Medications  Medication Sig Dispense Refill   amLODipine (NORVASC) 10 MG tablet Take 1 tablet (10 mg total) by mouth daily. 90 tablet 3   hydroxychloroquine (PLAQUENIL) 200 MG tablet Take 1 tablet (200 mg total) by mouth daily. 30 tablet 2   Iron, Ferrous Sulfate, 325 (65 Fe) MG TABS Take 325 mg by mouth daily. 30 tablet    No current facility-administered medications for this visit.     REVIEW OF  SYSTEMS:   Constitutional: Denies fevers, chills or night sweats Eyes: Denies blurriness of vision Ears, nose, mouth, throat, and face: Denies mucositis or sore throat Respiratory: Denies cough, dyspnea or wheezes Cardiovascular: Denies palpitation, chest discomfort or lower extremity swelling Gastrointestinal:  Denies  nausea, heartburn or change in bowel habits Skin: Denies abnormal skin rashes Lymphatics: Denies new lymphadenopathy or easy bruising Neurological:Denies numbness, tingling or new weaknesses Behavioral/Psych: Mood is stable, no new changes  All other systems were reviewed with the patient and are negative.  PHYSICAL EXAMINATION: ECOG PERFORMANCE STATUS: 1 - Symptomatic but completely ambulatory  Vitals:   01/18/23 0844  BP: 109/62  Pulse: 75  Resp: 18  Temp: 98.9 F (37.2 C)  SpO2: 100%   There were no vitals filed for this visit.  GENERAL:alert, no distress and comfortable NEURO: alert & oriented x 3 with fluent speech, no focal motor/sensory deficits  LABORATORY DATA:  I have reviewed the data as listed     Component Value Date/Time   NA 136 09/24/2022 0810   K 3.9 09/24/2022 0810   CL 104 09/24/2022 0810   CO2 24 09/24/2022 0810   GLUCOSE 91 09/24/2022 0810   BUN 9 09/24/2022 0810   CREATININE 0.63 09/24/2022 0810   CREATININE 0.75 04/24/2019 1655   CALCIUM 9.4 09/24/2022 0810   PROT 7.7 09/24/2022 0810   ALBUMIN 4.3 09/24/2022 0810   AST 16 09/24/2022 0810   ALT 13 09/24/2022 0810   ALKPHOS 76 09/24/2022 0810   BILITOT 0.2 09/24/2022 0810   GFRNONAA >60 06/23/2020 2318   GFRAA >60 10/14/2019 1525    No results found for: "SPEP", "UPEP"  Lab Results  Component Value Date   WBC 5.5 01/18/2023   NEUTROABS 3.2 01/18/2023   HGB 12.6 01/18/2023   HCT 37.9 01/18/2023   MCV 84.6 01/18/2023   PLT 287 01/18/2023      Chemistry      Component Value Date/Time   NA 136 09/24/2022 0810   K 3.9 09/24/2022 0810   CL 104 09/24/2022 0810   CO2 24 09/24/2022 0810   BUN 9 09/24/2022 0810   CREATININE 0.63 09/24/2022 0810   CREATININE 0.75 04/24/2019 1655      Component Value Date/Time   CALCIUM 9.4 09/24/2022 0810   ALKPHOS 76 09/24/2022 0810   AST 16 09/24/2022 0810   ALT 13 09/24/2022 0810   BILITOT 0.2 09/24/2022 0810

## 2023-01-27 ENCOUNTER — Ambulatory Visit: Payer: 59 | Admitting: Nurse Practitioner

## 2023-01-31 ENCOUNTER — Other Ambulatory Visit: Payer: Self-pay

## 2023-01-31 ENCOUNTER — Encounter (HOSPITAL_BASED_OUTPATIENT_CLINIC_OR_DEPARTMENT_OTHER): Payer: Self-pay

## 2023-01-31 ENCOUNTER — Emergency Department (HOSPITAL_BASED_OUTPATIENT_CLINIC_OR_DEPARTMENT_OTHER): Payer: 59

## 2023-01-31 ENCOUNTER — Emergency Department (HOSPITAL_BASED_OUTPATIENT_CLINIC_OR_DEPARTMENT_OTHER)
Admission: EM | Admit: 2023-01-31 | Discharge: 2023-01-31 | Disposition: A | Discharge status: Home / Self Care | Payer: 59 | Attending: Emergency Medicine | Admitting: Emergency Medicine

## 2023-01-31 DIAGNOSIS — I10 Essential (primary) hypertension: Secondary | ICD-10-CM | POA: Diagnosis not present

## 2023-01-31 DIAGNOSIS — R079 Chest pain, unspecified: Secondary | ICD-10-CM | POA: Insufficient documentation

## 2023-01-31 DIAGNOSIS — M79622 Pain in left upper arm: Secondary | ICD-10-CM | POA: Insufficient documentation

## 2023-01-31 LAB — BASIC METABOLIC PANEL
Anion gap: 9 (ref 5–15)
BUN: 9 mg/dL (ref 6–20)
CO2: 24 mmol/L (ref 22–32)
Calcium: 9.2 mg/dL (ref 8.9–10.3)
Chloride: 102 mmol/L (ref 98–111)
Creatinine, Ser: 0.71 mg/dL (ref 0.44–1.00)
GFR, Estimated: >60 mL/min (ref >60)
Glucose, Bld: 102 mg/dL — ABNORMAL HIGH (ref 70–99)
Potassium: 3.3 mmol/L — ABNORMAL LOW (ref 3.5–5.1)
Sodium: 135 mmol/L (ref 135–145)

## 2023-01-31 LAB — PREGNANCY, URINE: Preg Test, Ur: NEGATIVE

## 2023-01-31 LAB — CBC
HCT: 37.5 % (ref 36.0–46.0)
Hemoglobin: 12.3 g/dL (ref 12.0–15.0)
MCH: 27.5 pg (ref 26.0–34.0)
MCHC: 32.8 g/dL (ref 30.0–36.0)
MCV: 83.7 fL (ref 80.0–100.0)
Platelets: 369 10*3/uL (ref 150–400)
RBC: 4.48 MIL/uL (ref 3.87–5.11)
RDW: 13.2 % (ref 11.5–15.5)
WBC: 7.9 10*3/uL (ref 4.0–10.5)
nRBC: 0 % (ref 0.0–0.2)

## 2023-01-31 LAB — TROPONIN I (HIGH SENSITIVITY): Troponin I (High Sensitivity): <2 ng/L (ref <18)

## 2023-01-31 MED ORDER — NAPROXEN 500 MG PO TABS: 500.0000 mg | ORAL_TABLET | Freq: Two times a day (BID) | ORAL | 0 refills | Status: DC

## 2023-01-31 NOTE — Discharge Instructions (Signed)
You were evaluated in the Emergency Department and after careful evaluation, we did not find any emergent condition requiring admission or further testing in the hospital.  Your exam/testing today is overall reassuring.  Pain likely due to strain or spasm of one of the chest wall muscles.  Take the Naprosyn twice daily as needed for pain and follow-up with your regular doctor if it does not go away within the next few days.  Please return to the Emergency Department if you experience any worsening of your condition.   Thank you for allowing [place] to be a part of your care.

## 2023-01-31 NOTE — ED Triage Notes (Signed)
Pt c/o chest pain under her left breast when she breathes in that started 14:30.
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
## 2023-02-02 ENCOUNTER — Encounter: Payer: Self-pay | Admitting: Nurse Practitioner

## 2023-02-02 ENCOUNTER — Telehealth: Payer: 59 | Admitting: Nurse Practitioner

## 2023-02-02 DIAGNOSIS — E7800 Pure hypercholesterolemia, unspecified: Secondary | ICD-10-CM | POA: Diagnosis not present

## 2023-02-02 DIAGNOSIS — R0789 Other chest pain: Secondary | ICD-10-CM | POA: Diagnosis not present

## 2023-02-02 NOTE — Assessment & Plan Note (Signed)
Reports diet modification and daily exercise in last 3months. Lipid Panel     Component Value Date/Time   CHOL 203 (H) 09/24/2022 0810   TRIG 117.0 09/24/2022 0810   HDL 56.30 09/24/2022 0810   CHOLHDL 4 09/24/2022 0810   VLDL 23.4 09/24/2022 0810   LDLCALC 124 (H) 09/24/2022 0810    Repeat lipid panel

## 2023-02-02 NOTE — Progress Notes (Signed)
Virtual Visit via Video Note  I connected withNAME@ on 02/02/23 at 11:20 AM EDT by a video enabled telemedicine application and verified that I am speaking with the correct person using two identifiers.  Location: Patient:Home Provider: Office Participants: patient and provider  I discussed the limitations of evaluation and management by telemedicine and the availability of in person appointments. I also discussed with the patient that there may be a patient responsible charge related to this service. The patient expressed understanding and agreed to proceed.  CC:Had Chest pains Monday and went to ED.  Her Troponin was low and she is concerned about the results   History of Present Illness: Ms. Huetter presents with CP which led to ED visit on 01/31/2023. Reviewed labs results and CXR report: all normal. Diagnosed with musculoskeletal pain. Today, she reports symptoms are now resolved. She is concerned about possible elevated troponin results.  Observations/Objective: Physical Exam Nursing note reviewed.  Constitutional:      General: She is not in acute distress. Pulmonary:     Effort: Pulmonary effort is normal.  Neurological:     Mental Status: She is alert and oriented to person, place, and time.     Assessment and Plan: Sindee was seen today for abnormal labs .  Diagnoses and all orders for this visit:  Chest wall pain  Hypercholesterolemia -     Lipid panel; Future   Follow Up Instructions: I reviewed an explained each lab results with Ms. Fooks. Provided assurance about normal lab results. Advised to use tylenol or ibuprofen for pain as needed. Advised to schedule lab appt for repeat lipid panel   I discussed the assessment and treatment plan with the patient. The patient was provided an opportunity to ask questions and all were answered. The patient agreed with the plan and demonstrated an understanding of the instructions.   The patient was advised to call back  or seek an in-person evaluation if the symptoms worsen or if the condition fails to improve as anticipated.  Alysia Penna, NP

## 2023-02-03 ENCOUNTER — Telehealth: Payer: Self-pay | Admitting: Nurse Practitioner

## 2023-02-03 NOTE — Telephone Encounter (Signed)
I made a fasting  lab appointment for this pt for tomorrow

## 2023-02-04 ENCOUNTER — Other Ambulatory Visit: Payer: 59

## 2023-02-04 DIAGNOSIS — E7800 Pure hypercholesterolemia, unspecified: Secondary | ICD-10-CM | POA: Diagnosis not present

## 2023-02-04 LAB — LIPID PANEL
Cholesterol: 171 mg/dL (ref 0–200)
HDL: 47.5 mg/dL (ref >39.00)
LDL Cholesterol: 105 mg/dL — ABNORMAL HIGH (ref 0–99)
NonHDL: 123.72
Total CHOL/HDL Ratio: 4
Triglycerides: 94 mg/dL (ref 0.0–149.0)
VLDL: 18.8 mg/dL (ref 0.0–40.0)

## 2023-04-21 ENCOUNTER — Telehealth: Payer: Self-pay | Admitting: Nurse Practitioner

## 2023-04-21 NOTE — Telephone Encounter (Signed)
Pt scheduled an OV with Charlotte on 04/27/23 for not feeling well. I called her to move up her appt, I asked her what was going on. She stated she was in extreme pain from her RA, her body and head hurt, she felt swollen all over and her heart was racing. I tried transferring her over to nurse triage. She couldn't wait, because she was at work. I called nurse triage and relayed the message.

## 2023-04-27 ENCOUNTER — Encounter: Payer: Self-pay | Admitting: Nurse Practitioner

## 2023-04-27 ENCOUNTER — Ambulatory Visit: Payer: Medicaid Other | Admitting: Nurse Practitioner

## 2023-04-27 VITALS — BP 108/77 | HR 78 | Temp 97.9°F | Resp 18 | Wt 133.0 lb

## 2023-04-27 DIAGNOSIS — Z8249 Family history of ischemic heart disease and other diseases of the circulatory system: Secondary | ICD-10-CM | POA: Diagnosis not present

## 2023-04-27 DIAGNOSIS — R0789 Other chest pain: Secondary | ICD-10-CM | POA: Diagnosis not present

## 2023-04-27 MED ORDER — ASPIRIN 81 MG PO TBEC: 81.0000 mg | DELAYED_RELEASE_TABLET | Freq: Every day | ORAL | Status: DC

## 2023-04-27 NOTE — Patient Instructions (Signed)
Start Aspirin 81mg  daily (enteric coated) Contact rheumatology about leg pain and plaquenil prescription. Continue warm compress and tylenol for left chest wall tenderness  Nonspecific Chest Pain Chest pain can be caused by many different conditions. Some causes of chest pain can be life-threatening. These will require treatment right away. Serious causes of chest pain include: Heart attack. A tear in the body's main blood vessel. Redness and swelling (inflammation) around your heart. Blood clot in your lungs. Other causes of chest pain may not be so serious. These include: Heartburn. Anxiety or stress. Damage to bones or muscles in your chest. Lung infections. Chest pain can feel like: Pain or discomfort in your chest. Crushing, pressure, aching, or squeezing pain. Burning or tingling. Dull or sharp pain that is worse when you move, cough, or take a deep breath. Pain or discomfort that is also felt in your back, neck, jaw, shoulder, or arm, or pain that spreads to any of these areas. It is hard to know whether your pain is caused by something that is serious or something that is not so serious. So it is important to see your doctor right away if you have chest pain. Follow these instructions at home: Medicines Take over-the-counter and prescription medicines only as told by your doctor. If you were prescribed an antibiotic medicine, take it as told by your doctor. Do not stop taking the antibiotic even if you start to feel better. Lifestyle  Rest as told by your doctor. Do not use any products that contain nicotine or tobacco, such as cigarettes, e-cigarettes, and chewing tobacco. If you need help quitting, ask your doctor. Do not drink alcohol. Make lifestyle changes as told by your doctor. These may include: Getting regular exercise. Ask your doctor what activities are safe for you. Eating a heart-healthy diet. A diet and nutrition specialist (dietitian) can help you to learn  healthy eating options. Staying at a healthy weight. Treating diabetes or high blood pressure, if needed. Lowering your stress. Activities such as yoga and relaxation techniques can help. General instructions Pay attention to any changes in your symptoms. Tell your doctor about them or any new symptoms. Avoid any activities that cause chest pain. Keep all follow-up visits as told by your doctor. This is important. You may need more testing if your chest pain does not go away. Contact a doctor if: Your chest pain does not go away. You feel depressed. You have a fever. Get help right away if: Your chest pain is worse. You have a cough that gets worse, or you cough up blood. You have very bad (severe) pain in your belly (abdomen). You pass out (faint). You have either of these for no clear reason: Sudden chest discomfort. Sudden discomfort in your arms, back, neck, or jaw. You have shortness of breath at any time. You suddenly start to sweat, or your skin gets clammy. You feel sick to your stomach (nauseous). You throw up (vomit). You suddenly feel lightheaded or dizzy. You feel very weak or tired. Your heart starts to beat fast, or it feels like it is skipping beats. These symptoms may be an emergency. Do not wait to see if the symptoms will go away. Get medical help right away. Call your local emergency services (911 in the [place] Do not drive yourself to the hospital. Summary Chest pain can be caused by many different conditions. The cause may be serious and need treatment right away. If you have chest pain, see your doctor right away. Follow  your doctor's instructions for taking medicines and making lifestyle changes. Keep all follow-up visits as told by your doctor. This includes visits for any further testing if your chest pain does not go away. Be sure to know the signs that show that your condition has become worse. Get help right away if you have these symptoms. This  information is not intended to replace advice given to you by your health care provider. Make sure you discuss any questions you have with your health care provider. Document Revised: 04/22/2022 Document Reviewed: 04/22/2022 Elsevier Patient Education  2024 [employer].

## 2023-04-27 NOTE — Progress Notes (Signed)
Established Patient Visit  Patient: Lindsey Carlson   DOB: Dec 20, 1980   42 y.o. Female  MRN: [id number] Visit Date: 04/27/2023  Subjective:    Chief Complaint  Patient presents with   Acute Visit    PT C/O left side pain with tightness in chest for 2 weeks. PT also voiced shooting pain in legs. No injury or fall occurred.    HPI Atypical chest pain Describes as Chest tightness, onset 2weeks ago, constant, waxing and waning. Denies any chest pain during OV today No improvement with Tylenol and ibuprofen, warm compress No specific trigger No nausea or GERD or ABDOMEN pain or back pain or palpitations or neck pain. No tobacco use, no ALCOHOL consumption. Hx of HYPERTENSION: BP at goal with amlodipine No hx of DM or known CAD Fhx of CAD-father and uncle at age 69  Entered cardiology referral Start ASA 81mg  EC daily Provided ED precautions   Reviewed medical, surgical, and social history today  Medications: Outpatient Medications Prior to Visit  Medication Sig   amLODipine (NORVASC) 10 MG tablet Take 1 tablet (10 mg total) by mouth daily.   hydroxychloroquine (PLAQUENIL) 200 MG tablet Take 1 tablet (200 mg total) by mouth daily.   Iron, Ferrous Sulfate, 325 (65 Fe) MG TABS Take 325 mg by mouth daily.   naproxen (NAPROSYN) 500 MG tablet Take 1 tablet (500 mg total) by mouth 2 (two) times daily.   No facility-administered medications prior to visit.   Reviewed past medical and social history.   ROS per HPI above  Last CBC Lab Results  Component Value Date   WBC 7.9 01/31/2023   HGB 12.3 01/31/2023   HCT 37.5 01/31/2023   MCV 83.7 01/31/2023   MCH 27.5 01/31/2023   RDW 13.2 01/31/2023   PLT 369 01/31/2023   Last metabolic panel Lab Results  Component Value Date   GLUCOSE 102 (H) 01/31/2023   NA 135 01/31/2023   K 3.3 (L) 01/31/2023   CL 102 01/31/2023   CO2 24 01/31/2023   BUN 9 01/31/2023   CREATININE 0.71 01/31/2023   GFRNONAA >60 01/31/2023    CALCIUM 9.2 01/31/2023   PROT 7.7 09/24/2022   ALBUMIN 4.3 09/24/2022   BILITOT 0.2 09/24/2022   ALKPHOS 76 09/24/2022   AST 16 09/24/2022   ALT 13 09/24/2022   ANIONGAP 9 01/31/2023   Last lipids Lab Results  Component Value Date   CHOL 171 02/04/2023   HDL 47.50 02/04/2023   LDLCALC 105 (H) 02/04/2023   TRIG 94.0 02/04/2023   CHOLHDL 4 02/04/2023   Last hemoglobin A1c No results found for: "HGBA1C" Last thyroid functions Lab Results  Component Value Date   TSH 2.45 03/10/2021        Objective:  BP 108/77 (BP Location: Left Arm, Patient Position: Sitting, Cuff Size: Normal)   Pulse 78   Temp 97.9 F (36.6 C) (Temporal)   Resp 18   Wt 133 lb (60.3 kg)   SpO2 100%   BMI 25.13 kg/m      Physical Exam Cardiovascular:     Rate and Rhythm: Normal rate and regular rhythm.     Pulses: Normal pulses.     Heart sounds: Normal heart sounds.  Pulmonary:     Effort: Pulmonary effort is normal.     Breath sounds: Normal breath sounds.  Chest:     Chest wall: No tenderness.  Skin:  Findings: No erythema or rash.  Neurological:     Mental Status: She is alert and oriented to person, place, and time.     No results found for any visits on 04/27/23.    Assessment & Plan:    Problem List Items Addressed This Visit     Atypical chest pain - Primary    Describes as Chest tightness, onset 2weeks ago, constant, waxing and waning. Denies any chest pain during OV today No improvement with Tylenol and ibuprofen, warm compress No specific trigger No nausea or GERD or ABDOMEN pain or back pain or palpitations or neck pain. No tobacco use, no ALCOHOL consumption. Hx of HYPERTENSION: BP at goal with amlodipine No hx of DM or known CAD Fhx of CAD-father and uncle at age 32  Entered cardiology referral Start ASA 81mg  EC daily Provided ED precautions       Relevant Medications   aspirin EC 81 MG tablet   Other Relevant Orders   Ambulatory referral to Cardiology    Family history of heart disease in female family member before age 75   Relevant Medications   aspirin EC 81 MG tablet   Other Relevant Orders   Ambulatory referral to Cardiology   Return if symptoms worsen or fail to improve.     Alysia Penna, NP

## 2023-04-27 NOTE — Assessment & Plan Note (Addendum)
Describes as Chest tightness, onset 2weeks ago, constant, waxing and waning. Denies any chest pain during OV today No improvement with Tylenol and ibuprofen, warm compress No specific trigger No nausea or GERD or ABDOMEN pain or back pain or palpitations or neck pain. No tobacco use, no ALCOHOL consumption. Hx of HYPERTENSION: BP at goal with amlodipine No hx of DM or known CAD Fhx of CAD-father and uncle at age 44  Entered cardiology referral Start ASA 81mg  EC daily Provided ED precautions

## 2023-04-29 ENCOUNTER — Ambulatory Visit: Payer: Medicaid Other | Admitting: Nurse Practitioner

## 2023-05-30 ENCOUNTER — Telehealth: Payer: Self-pay | Admitting: Nurse Practitioner

## 2023-05-30 ENCOUNTER — Telehealth: Payer: BC Managed Care – PPO | Admitting: Nurse Practitioner

## 2023-05-30 NOTE — Telephone Encounter (Signed)
No show- pt made video visit and she need to be seen in office per pcp

## 2023-05-31 NOTE — Telephone Encounter (Signed)
Not counted as missed visit

## 2023-06-07 ENCOUNTER — Ambulatory Visit
Admission: EM | Admit: 2023-06-07 | Discharge: 2023-06-07 | Disposition: A | Discharge status: Home / Self Care | Payer: BC Managed Care – PPO | Attending: Physician Assistant | Admitting: Physician Assistant

## 2023-06-07 DIAGNOSIS — J069 Acute upper respiratory infection, unspecified: Secondary | ICD-10-CM | POA: Diagnosis not present

## 2023-06-07 LAB — POCT INFLUENZA A/B
Influenza A, POC: NEGATIVE
Influenza B, POC: NEGATIVE

## 2023-06-07 LAB — POCT RAPID STREP A (OFFICE): Rapid Strep A Screen: NEGATIVE

## 2023-06-07 NOTE — ED Triage Notes (Signed)
Additional information: "I work at [employer] that just opened and everyone is sick". Also "my right side hurts with breathing". No injury.

## 2023-06-07 NOTE — ED Triage Notes (Signed)
"  This started with Right ear pain and sore throat on Thursday, continuing to weekend with sore throat continuing and no ear pain. Yesterday I started with feeling of complete exhaustion with ? Fever, I had chills since weekend and earlier". Coughing "sometimes". Some "stuffy" not runny nose. No flu or covid19 vaccine this season.
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
## 2023-06-08 LAB — SARS CORONAVIRUS 2 (TAT 6-24 HRS): SARS Coronavirus 2: NEGATIVE

## 2023-06-15 ENCOUNTER — Emergency Department (HOSPITAL_BASED_OUTPATIENT_CLINIC_OR_DEPARTMENT_OTHER): Payer: BC Managed Care – PPO

## 2023-06-15 ENCOUNTER — Encounter (HOSPITAL_BASED_OUTPATIENT_CLINIC_OR_DEPARTMENT_OTHER): Payer: Self-pay | Admitting: Emergency Medicine

## 2023-06-15 ENCOUNTER — Emergency Department (HOSPITAL_BASED_OUTPATIENT_CLINIC_OR_DEPARTMENT_OTHER)
Admission: EM | Admit: 2023-06-15 | Discharge: 2023-06-15 | Disposition: A | Discharge status: Home / Self Care | Payer: BC Managed Care – PPO | Attending: Emergency Medicine | Admitting: Emergency Medicine

## 2023-06-15 ENCOUNTER — Other Ambulatory Visit: Payer: Self-pay

## 2023-06-15 DIAGNOSIS — I10 Essential (primary) hypertension: Secondary | ICD-10-CM | POA: Diagnosis not present

## 2023-06-15 DIAGNOSIS — R091 Pleurisy: Secondary | ICD-10-CM | POA: Diagnosis not present

## 2023-06-15 DIAGNOSIS — R0602 Shortness of breath: Secondary | ICD-10-CM | POA: Diagnosis not present

## 2023-06-15 DIAGNOSIS — Z79899 Other long term (current) drug therapy: Secondary | ICD-10-CM | POA: Diagnosis not present

## 2023-06-15 DIAGNOSIS — Z7982 Long term (current) use of aspirin: Secondary | ICD-10-CM | POA: Insufficient documentation

## 2023-06-15 DIAGNOSIS — R059 Cough, unspecified: Secondary | ICD-10-CM | POA: Diagnosis not present

## 2023-06-15 DIAGNOSIS — R0781 Pleurodynia: Secondary | ICD-10-CM | POA: Diagnosis not present

## 2023-06-15 DIAGNOSIS — J40 Bronchitis, not specified as acute or chronic: Secondary | ICD-10-CM | POA: Insufficient documentation

## 2023-06-15 MED ORDER — ACETAMINOPHEN-CODEINE 300-30 MG PO TABS: 1.0000 | ORAL_TABLET | Freq: Four times a day (QID) | ORAL | 0 refills | Status: DC | PRN

## 2023-06-15 MED ORDER — BENZONATATE 100 MG PO CAPS: 100.0000 mg | ORAL_CAPSULE | Freq: Three times a day (TID) | ORAL | 0 refills | Status: DC

## 2023-06-15 MED ORDER — AZITHROMYCIN 250 MG PO TABS: 250.0000 mg | ORAL_TABLET | Freq: Every day | ORAL | 0 refills | Status: DC

## 2023-06-15 MED ORDER — PREDNISONE 10 MG PO TABS: 40.0000 mg | ORAL_TABLET | Freq: Every day | ORAL | 0 refills | Status: AC

## 2023-06-15 NOTE — Discharge Instructions (Addendum)
Your chest XR was clear. Your sx are consistent with bronchitis. I have prescribed pain medicine, cough medicine, an antibiotic in the event this is developing pneumonia, and a steroid. Follow-up with your PCP, return for any worsening symptoms

## 2023-06-15 NOTE — ED Triage Notes (Signed)
Pt c/o cough and  shob, tx at UC x 1 week pta for same. Endorses rib pain and hoarseness

## 2023-06-15 NOTE — ED Provider Notes (Signed)
[place] [facility] Provider Note   CSN: [id number] Arrival date & time: 06/15/23  0645     History  Chief Complaint  Patient presents with   Shortness of Breath    Lindsey Carlson is a 42 y.o. female.   Shortness of Breath Associated symptoms: cough      42 year old female with medical history significant for rheumatoid arthritis, hypertension, presenting to the emergency department with cough and shortness of breath with associated pleuritic discomfort for the last week.  She endorses right-sided rib pain and a sore throat.  She endorses nasal congestion.  She was seen at urgent care 8 days ago and had negative COVID, flu, strep PCR testing.  No fevers, no abdominal pain, no nausea or vomiting, no genitourinary symptoms.  Home Medications Prior to Admission medications   Medication Sig Start Date End Date Taking? Authorizing Provider  acetaminophen-codeine (TYLENOL #3) 300-30 MG tablet Take 1-2 tablets by mouth every 6 (six) hours as needed for moderate pain (pain score 4-6). 06/15/23  Yes Ernie Avena, MD  azithromycin (ZITHROMAX) 250 MG tablet Take 1 tablet (250 mg total) by mouth daily. Take first 2 tablets together, then 1 every day until finished. 06/15/23  Yes Ernie Avena, MD  benzonatate (TESSALON) 100 MG capsule Take 1 capsule (100 mg total) by mouth every 8 (eight) hours. 06/15/23  Yes Ernie Avena, MD  predniSONE (DELTASONE) 10 MG tablet Take 4 tablets (40 mg total) by mouth daily for 5 days. 06/15/23 06/20/23 Yes Ernie Avena, MD  amLODipine (NORVASC) 10 MG tablet Take 1 tablet (10 mg total) by mouth daily. 08/24/22   [name], [name], NP  aspirin EC 81 MG tablet Take 1 tablet (81 mg total) by mouth daily. Swallow whole. 04/27/23   [name], [name], NP  hydroxychloroquine (PLAQUENIL) 200 MG tablet Take 1 tablet (200 mg total) by mouth daily. 11/21/22   Fuller Plan, MD  Iron, Ferrous Sulfate, 325 (65 Fe) MG TABS Take  325 mg by mouth daily. 06/29/22   [name], [name], NP  naproxen (NAPROSYN) 500 MG tablet Take 1 tablet (500 mg total) by mouth 2 (two) times daily. 01/31/23   Sabas Sous, MD      Allergies    Patient has no known allergies.    Review of Systems   Review of Systems  Respiratory:  Positive for cough and shortness of breath.   All other systems reviewed and are negative.   Physical Exam Updated Vital Signs BP 116/87   Pulse 86   Temp 97.8 F (36.6 C) (Oral)   Resp 20   LMP 06/08/2023 (Exact Date)   SpO2 98%  Physical Exam Vitals and nursing note reviewed.  Constitutional:      General: She is not in acute distress.    Appearance: She is well-developed.  HENT:     Head: Normocephalic and atraumatic.     Mouth/Throat:     Pharynx: Posterior oropharyngeal erythema present.     Tonsils: No tonsillar exudate or tonsillar abscesses.  Eyes:     Conjunctiva/sclera: Conjunctivae normal.  Cardiovascular:     Rate and Rhythm: Normal rate and regular rhythm.     Heart sounds: No murmur heard. Pulmonary:     Effort: Pulmonary effort is normal. No respiratory distress.     Breath sounds: Normal breath sounds.  Abdominal:     Palpations: Abdomen is soft.     Tenderness: There is no abdominal tenderness.  Musculoskeletal:  General: No swelling.     Cervical back: Neck supple.  Skin:    General: Skin is warm and dry.     Capillary Refill: Capillary refill takes less than 2 seconds.  Neurological:     Mental Status: She is alert.  Psychiatric:        Mood and Affect: Mood normal.     ED Results / Procedures / Treatments   Labs (all labs ordered are listed, but only abnormal results are displayed) Labs Reviewed - No data to display  EKG None  Radiology DG Chest 2 View Result Date: 06/15/2023 CLINICAL DATA:  42 year old female with cough, shortness of breath, rib pain. EXAM: CHEST - 2 VIEW COMPARISON:  Chest radiographs 01/31/2023 and earlier. FINDINGS: PA and  lateral views 0725 hours. Lung volumes and mediastinal contours are stable and within normal limits. Visualized tracheal air column is within normal limits. Both lungs appear stable and clear. No pneumothorax or pleural effusion. No acute osseous abnormality identified. Negative visible bowel gas. IMPRESSION: Negative.  No cardiopulmonary abnormality. Electronically Signed   By: Odessa Fleming M.D.   On: 06/15/2023 07:33    Procedures Procedures    Medications Ordered in ED Medications - No data to display  ED Course/ Medical Decision Making/ A&P                                 Medical Decision Making Amount and/or Complexity of Data Reviewed Radiology: ordered.  Risk Prescription drug management.    42 year old female with medical history significant for rheumatoid arthritis, hypertension, presenting to the emergency department with cough and shortness of breath with associated pleuritic discomfort for the last week.  She endorses right-sided rib pain and a sore throat.  She endorses nasal congestion.  She was seen at urgent care 8 days ago and had negative COVID, flu, strep PCR testing.  No fevers, no abdominal pain, no nausea or vomiting, no genitourinary symptoms.  On arrival, the patient was vitally stable, afebrile, not tachycardic or tachypneic, BP 116/87, saturating 98% on room air.  Patient was overall well-appearing, lungs clear to auscultation on exam, mild oropharyngeal erythema present.  Symptoms been ongoing for 8 days, previously negative for COVID and flu.  Checks x-ray imaging was performed to evaluate for developing bacterial pneumonia and resulted clear.  Patient is overall well-appearing, not tachycardic, not hypoxic, not actively experiencing chest discomfort, endorses a cough with associated rib pain in the setting of heavy coughing.  No active chest pain.  Low concern for PE, ACS, other acute intrathoracic abnormality.  Symptoms are consistent with likely bronchitis, consider  developing bacterial pneumonia.  Will treat for the same.  Prescribed azithromycin, prednisone, Tessalon, Tylenol 3.  Advised symptomatic management outpatient and continued supportive care, return precautions provided.   Final Clinical Impression(s) / ED Diagnoses Final diagnoses:  Bronchitis  Pleuritis    Rx / DC Orders ED Discharge Orders          Ordered    acetaminophen-codeine (TYLENOL #3) 300-30 MG tablet  Every 6 hours PRN        06/15/23 0823    benzonatate (TESSALON) 100 MG capsule  Every 8 hours        06/15/23 0823    azithromycin (ZITHROMAX) 250 MG tablet  Daily        06/15/23 0823    predniSONE (DELTASONE) 10 MG tablet  Daily  06/15/23 1610              Ernie Avena, MD 06/15/23 [phone]

## 2023-06-15 NOTE — ED Notes (Signed)
Discharge instructions reviewed with patient. Patient verbalizes understanding, no further questions at this time. Medications/prescriptions and follow up information provided. No acute distress noted at time of departure.  

## 2023-06-17 ENCOUNTER — Telehealth: Payer: Self-pay

## 2023-06-17 NOTE — Transitions of Care (Post Inpatient/ED Visit) (Signed)
   06/17/2023  Name: Lindsey Carlson MRN: [id number] DOB: 1980/10/14  Today's TOC FU Call Status: Today's TOC FU Call Status:: Unsuccessful Call (1st Attempt) Unsuccessful Call (1st Attempt) Date: 06/17/23  Attempted to reach the patient regarding the most recent Inpatient/ED visit.  Follow Up Plan: Additional outreach attempts will be made to reach the patient to complete the Transitions of Care (Post Inpatient/ED visit) call.   Derenda Fennel, RMA

## 2023-06-27 ENCOUNTER — Ambulatory Visit: Payer: BC Managed Care – PPO | Attending: Internal Medicine | Admitting: Internal Medicine

## 2023-06-27 ENCOUNTER — Encounter: Payer: Self-pay | Admitting: Internal Medicine

## 2023-06-27 ENCOUNTER — Ambulatory Visit (INDEPENDENT_AMBULATORY_CARE_PROVIDER_SITE_OTHER): Payer: BC Managed Care – PPO

## 2023-06-27 VITALS — BP 118/64 | HR 71 | Ht 61.0 in | Wt 137.2 lb

## 2023-06-27 DIAGNOSIS — R002 Palpitations: Secondary | ICD-10-CM

## 2023-06-27 DIAGNOSIS — R079 Chest pain, unspecified: Secondary | ICD-10-CM | POA: Diagnosis not present

## 2023-06-27 NOTE — Progress Notes (Unsigned)
 Enrolled for [employer] to mail a ZIO XT long term holter monitor to the patients address on file.   Dr. Carolan Clines to read.

## 2023-06-27 NOTE — Patient Instructions (Signed)
 Medication Instructions:  Stop: Aspirin  81 mg EC tablet *If you need a refill on your cardiac medications before your next appointment, please call your pharmacy*  Lab Work: None  Testing/Procedures:  Lindsey Carlson- Long Term Monitor Instructions  Your physician has requested you wear a ZIO patch monitor for 3 days.  This is a single patch monitor. [employer] supplies one patch monitor per enrollment. Additional stickers are not available. Please do not apply patch if you will be having a Nuclear Stress Test,  Echocardiogram, Cardiac CT, MRI, or Chest Xray during the period you would be wearing the  monitor. The patch cannot be worn during these tests. You cannot remove and re-apply the  ZIO XT patch monitor.  Your ZIO patch monitor will be mailed 3 day USPS to your address on file. It may take 3-5 days  to receive your monitor after you have been enrolled.  Once you have received your monitor, please review the enclosed instructions. Your monitor  has already been registered assigning a specific monitor serial # to you.  Billing and Patient Assistance Program Information  We have supplied [employer] with any of your insurance information on file for billing purposes. [employer] offers a sliding scale Patient Assistance Program for patients that do not have  insurance, or whose insurance does not completely cover the cost of the ZIO monitor.  You must apply for the Patient Assistance Program to qualify for this discounted rate.  To apply, please call [employer] at [phone], select option 4, select option 2, ask to apply for  Patient Assistance Program. Meredeth will ask your household income, and how many people  are in your household. They will quote your out-of-pocket cost based on that information.  [employer] will also be able to set up a 6-month, interest-free payment plan if needed.  Applying the monitor   Shave hair from upper left chest.  Hold abrader disc by orange tab. Rub abrader in 40  strokes over the upper left chest as  indicated in your monitor instructions.  Clean area with 4 enclosed alcohol pads. Let dry.  Apply patch as indicated in monitor instructions. Patch will be placed under collarbone on left  side of chest with arrow pointing upward.  Rub patch adhesive wings for 2 minutes. Remove white label marked 1. Remove the white  label marked 2. Rub patch adhesive wings for 2 additional minutes.  While looking in a mirror, press and release button in center of patch. A small green light will  flash 3-4 times. This will be your only indicator that the monitor has been turned on.  Do not shower for the first 24 hours. You may shower after the first 24 hours.  Press the button if you feel a symptom. You will hear a small click. Record Date, Time and  Symptom in the Patient Logbook.  When you are ready to remove the patch, follow instructions on the last 2 pages of Patient  Logbook. Stick patch monitor onto the last page of Patient Logbook.  Place Patient Logbook in the blue and white box. Use locking tab on box and tape box closed  securely. The blue and white box has prepaid postage on it. Please place it in the mailbox as  soon as possible. Your physician should have your test results approximately 7 days after the  monitor has been mailed back to [facility].  Call [facility] Customer Care at [phone] if you have questions regarding  your ZIO XT patch monitor. Call  them immediately if you see an orange light blinking on your  monitor.  If your monitor falls off in less than 4 days, contact our Monitor department at [phone].  If your monitor becomes loose or falls off after 4 days call [employer] at [phone] for  suggestions on securing your monitor   Follow-Up: At [facility], you and your health needs are our priority.  As part of our continuing mission to provide you with exceptional heart care, we have created designated Provider  Care Teams.  These Care Teams include your primary Cardiologist (physician) and Advanced Practice Providers (APPs -  Physician Assistants and Nurse Practitioners) who all work together to provide you with the care you need, when you need it.  Your next appointment:   As needed (Call office or send a My Chart message)  Provider:   Alvan Ronal BRAVO, MD

## 2023-06-27 NOTE — Progress Notes (Signed)
  Cardiology Office Note:  .   Date:  06/27/2023  ID:  Lindsey Carlson, DOB 1980/10/29, MRN [id number] PCP: Katheen Roselie Rockford, NP  [facility] Providers Cardiologist:  Alvan Ronal BRAVO, MD    History of Present Illness: .   Lindsey Carlson is a 43 y.o. female with no CVD risk factors, who is referred to cardiology clinic for cardiac risk assessment. She went to the ED for palpitations and had associated CP. She reported Cp to her NP and recommended to take aspirin .  She feels uncomfortable pain on the side. She notes she has some missed beats. The duration of symptoms if 2 years.  She walks 7 miles a day at her job.  Her father had MIx2 under age 20   The 10-year ASCVD risk score (Arnett DK, et al., 2019) is: 0.8%   Values used to calculate the score:     Age: 74 years     Sex: Female     Is Non-Hispanic African American: No     Diabetic: No     Tobacco smoker: No     Systolic Blood Pressure: 116 mmHg     Is BP treated: Yes     HDL Cholesterol: 47.5 mg/dL     Total Cholesterol: 171 mg/dL   ROS:  per HPI otherwise negative   Studies Reviewed: .         Risk Assessment/Calculations:    Physical Exam:   VS:  Vitals:   06/27/23 1638  BP: 118/64  Pulse: 71  SpO2: 99%      LMP 06/08/2023 (Exact Date)    Wt Readings from Last 3 Encounters:  06/07/23 132 lb 15 oz (60.3 kg)  04/27/23 133 lb (60.3 kg)  01/31/23 130 lb (59 kg)    GEN: Well nourished, well developed in no acute distress NECK: No JVD; No carotid bruits CARDIAC: RRR, no murmurs, rubs, gallops RESPIRATORY:  Clear to auscultation without rales, wheezing or rhonchi  ABDOMEN: Soft, non-tender, non-distended EXTREMITIES:  No edema; No deformity   ASSESSMENT AND PLAN: .   CVD risk assessment Non cardiac CP - she is very low risk for coronary disease and her symptoms have been going on for 2 years. There is no indication for further ischemic workup.  She does not need to take aspirin . - TC 171 mg/dL, LDL  894 mg/dL  Palpitations Her ECG today is normal 3 days ziopatch  Dispo: Follow up PRN  Signed, Alvan Ronal BRAVO, MD

## 2023-06-29 ENCOUNTER — Ambulatory Visit: Payer: Medicaid Other | Admitting: Internal Medicine
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
## 2023-07-04 DIAGNOSIS — R002 Palpitations: Secondary | ICD-10-CM

## 2023-07-12 ENCOUNTER — Encounter: Payer: Self-pay | Admitting: Internal Medicine

## 2023-08-26 ENCOUNTER — Ambulatory Visit: Admitting: Nurse Practitioner

## 2023-08-26 VITALS — BP 108/66 | HR 76 | Temp 98.9°F | Wt 126.8 lb

## 2023-08-26 DIAGNOSIS — M069 Rheumatoid arthritis, unspecified: Secondary | ICD-10-CM

## 2023-08-26 DIAGNOSIS — G43009 Migraine without aura, not intractable, without status migrainosus: Secondary | ICD-10-CM

## 2023-08-26 DIAGNOSIS — I10 Essential (primary) hypertension: Secondary | ICD-10-CM

## 2023-08-26 MED ORDER — AMLODIPINE BESYLATE 10 MG PO TABS
10.0000 mg | ORAL_TABLET | Freq: Every day | ORAL | 3 refills | Status: DC
Start: 2023-08-26 — End: 2024-05-10

## 2023-08-26 NOTE — Progress Notes (Signed)
 Established Patient Visit  Patient: Lindsey Carlson   DOB: 1981-04-17   43 y.o. Female  MRN: [id number] Visit Date: 08/26/2023  Subjective:    Chief Complaint  Patient presents with   Headache    Headache and fatigue X 1 week. Having RA flare ups.    Headache  This is a new problem. The current episode started in the past 7 days. The problem occurs intermittently. The problem has been unchanged. The pain is located in the Occipital region. The pain does not radiate. The quality of the pain is described as dull and aching. Associated symptoms include photophobia. Pertinent negatives include no abdominal pain, abnormal behavior, back pain, blurred vision, coughing, dizziness, drainage, ear pain, eye pain, eye redness, eye watering, facial sweating, fever, hearing loss, insomnia, loss of balance, muscle aches, nausea, neck pain, numbness, phonophobia, rhinorrhea, scalp tenderness, seizures, sinus pressure, sore throat, swollen glands, tingling, tinnitus, visual change, vomiting or weakness. The symptoms are aggravated by unknown. She has tried acetaminophen and NSAIDs for the symptoms. The treatment provided significant relief. Her past medical history is significant for hypertension. There is no history of migraine headaches, migraines in the family, obesity, recent head traumas or sinus disease.   Rheumatoid arthritis (HCC) Unable to tolerate plaquenil, last appointment with rheumatology 01/2023.  Advised to maintain naproxen and schedule f/up with rheumatology  Reviewed medical, surgical, and social history today  Medications: Outpatient Medications Prior to Visit  Medication Sig   [DISCONTINUED] amLODipine (NORVASC) 10 MG tablet Take 1 tablet (10 mg total) by mouth daily.   [DISCONTINUED] acetaminophen-codeine (TYLENOL #3) 300-30 MG tablet Take 1-2 tablets by mouth every 6 (six) hours as needed for moderate pain (pain score 4-6).   [DISCONTINUED] azithromycin (ZITHROMAX) 250 MG  tablet Take 1 tablet (250 mg total) by mouth daily. Take first 2 tablets together, then 1 every day until finished.   [DISCONTINUED] benzonatate (TESSALON) 100 MG capsule Take 1 capsule (100 mg total) by mouth every 8 (eight) hours.   [DISCONTINUED] hydroxychloroquine (PLAQUENIL) 200 MG tablet Take 1 tablet (200 mg total) by mouth daily.   [DISCONTINUED] Iron, Ferrous Sulfate, 325 (65 Fe) MG TABS Take 325 mg by mouth daily.   [DISCONTINUED] naproxen (NAPROSYN) 500 MG tablet Take 1 tablet (500 mg total) by mouth 2 (two) times daily.   No facility-administered medications prior to visit.   Reviewed past medical and social history.   ROS per HPI above      Objective:  BP 108/66   Pulse 76   Temp 98.9 F (37.2 C) (Temporal)   Wt 126 lb 12.8 oz (57.5 kg)   LMP 08/11/2023   SpO2 99%   BMI 23.96 kg/m      Physical Exam Vitals and nursing note reviewed.  Eyes:     Extraocular Movements: Extraocular movements intact.     Pupils: Pupils are equal, round, and reactive to light.  Cardiovascular:     Rate and Rhythm: Normal rate and regular rhythm.     Pulses: Normal pulses.     Heart sounds: Normal heart sounds.  Pulmonary:     Effort: Pulmonary effort is normal.     Breath sounds: Normal breath sounds.  Musculoskeletal:     Cervical back: Normal range of motion and neck supple.  Lymphadenopathy:     Cervical: No cervical adenopathy.  Neurological:     Mental Status: She is alert and oriented to person,  place, and time.     Cranial Nerves: No cranial nerve deficit or facial asymmetry.  Psychiatric:        Mood and Affect: Mood normal.        Speech: Speech normal.        Behavior: Behavior normal.     No results found for any visits on 08/26/23.    Assessment & Plan:    Problem List Items Addressed This Visit     Primary hypertension   Relevant Medications   amLODipine (NORVASC) 10 MG tablet   Rheumatoid arthritis (HCC)   Unable to tolerate plaquenil, last  appointment with rheumatology 01/2023.  Advised to maintain naproxen and schedule f/up with rheumatology      Other Visit Diagnoses       Migraine without aura and without status migrainosus, not intractable    -  Primary   Relevant Medications   amLODipine (NORVASC) 10 MG tablet      Return if symptoms worsen or fail to improve.     Alysia Penna, NP

## 2023-08-26 NOTE — Assessment & Plan Note (Signed)
 Unable to tolerate plaquenil, last appointment with rheumatology 01/2023.  Advised to maintain naproxen and schedule f/up with rheumatology

## 2023-08-26 NOTE — Patient Instructions (Signed)
 Continue use of naproxen or tylenol as needed for pain. Schedule appointment with rheumatology.  Migraine Headache A migraine headache is an intense pulsing or throbbing pain on one or both sides of the head. Migraine headaches may also cause other symptoms, such as nausea, vomiting, and sensitivity to light and noise. A migraine headache can last from 4 hours to 3 days. Talk with your health care provider about what things may bring on (trigger) your migraine headaches. What are the causes? The exact cause is not known. However, a migraine may be caused when nerves in the brain get irritated and release chemicals that cause blood vessels to become inflamed. This inflammation causes pain. Migraines may be triggered or caused by: Smoking. Medicines, such as: Nitroglycerin, which is used to treat chest pain. Birth control pills. Estrogen. Certain blood pressure medicines. Foods or drinks that contain nitrates, glutamate, aspartame, MSG, or tyramine. Certain foods or drinks, such as aged cheeses, chocolate, alcohol, or caffeine. Doing physical activity that is very hard. Other triggers may include: Menstruation. Pregnancy. Hunger. Stress. Getting too much or too little sleep. Weather changes. Tiredness (fatigue). What increases the risk? The following factors may make you more likely to have migraine headaches: Being between the ages of 6-43 years old. Being female. Having a family history of migraine headaches. Being Caucasian. Having a mental health condition, such as depression or anxiety. Being obese. What are the signs or symptoms? The main symptom of this condition is pulsing or throbbing pain. This pain may: Happen in any area of the head, such as on one or both sides. Make it hard to do daily activities. Get worse with physical activity. Get worse around bright lights, loud noises, or smells. Other symptoms may include: Nausea. Vomiting. Dizziness. Before a migraine  headache starts, you may get warning signs (an aura). An aura may include: Seeing flashing lights or having blind spots. Seeing bright spots, halos, or zigzag lines. Having tunnel vision or blurred vision. Having numbness or a tingling feeling. Having trouble talking. Having muscle weakness. After a migraine ends, you may have symptoms. These may include: Feeling tired. Trouble concentrating. How is this diagnosed? A migraine headache can be diagnosed based on: Your symptoms. A physical exam. Tests, such as: A CT scan or an MRI of the head. These tests can help rule out other causes of headaches. Taking fluid from the spine (lumbar puncture) to examine it (cerebrospinal fluid analysis, or CSF analysis). How is this treated? This condition may be treated with medicines that: Relieve pain and nausea. Prevent migraines. Treatment may also include: Acupuncture. Lifestyle changes like avoiding foods that trigger migraine headaches. Learning ways to control your body (biofeedback). Talk therapy to help you know and deal with negative thoughts (cognitive behavioral therapy). Follow these instructions at home: Medicines Take over-the-counter and prescription medicines only as told by your provider. Ask your provider if the medicine prescribed to you: Requires you to avoid driving or using machinery. Can cause constipation. You may need to take these actions to prevent or treat constipation: Drink enough fluid to keep your pee (urine) pale yellow. Take over-the-counter or prescription medicines. Eat foods that are high in fiber, such as beans, whole grains, and fresh fruits and vegetables. Limit foods that are high in fat and processed sugars, such as fried or sweet foods. Lifestyle  Do not drink alcohol. Do not use any products that contain nicotine or tobacco. These products include cigarettes, chewing tobacco, and vaping devices, such as e-cigarettes. If you  need help quitting, ask  your provider. Get 7-9 hours of sleep each night, or the amount recommended by your provider. Find ways to manage stress, such as meditation, deep breathing, or yoga. Try to exercise regularly. This can help lessen how bad and how often your migraines occur. General instructions Keep a journal to find out what triggers your migraines, so you can avoid those things. For example, write down: What you eat and drink. How much sleep you get. Any change to your diet or medicines. If you have a migraine headache: Avoid things that make your symptoms worse, such as bright lights. Lie down in a dark, quiet room. Do not drive or use machinery. Ask your provider what activities are safe for you while you have symptoms. Keep all follow-up visits. Your provider will monitor your symptoms and recommend any further treatment. Where to find more information Coalition for Headache and Migraine Patients (CHAMP): headachemigraine.org American Migraine Foundation: americanmigrainefoundation.org National Headache Foundation: headaches.org Contact a health care provider if: You have symptoms that are different or worse than your usual migraine headache symptoms. You have more than 15 days of headaches in one month. Get help right away if: Your migraine headache becomes severe or lasts more than 72 hours. You have a fever or stiff neck. You have vision loss. Your muscles feel weak or like you cannot control them. You lose your balance often or have trouble walking. You faint. You have a seizure. This information is not intended to replace advice given to you by your health care provider. Make sure you discuss any questions you have with your health care provider. Document Revised: 02/01/2022 Document Reviewed: 02/01/2022 Elsevier Patient Education  2024 [employer].

## 2023-08-28 ENCOUNTER — Emergency Department (HOSPITAL_COMMUNITY)

## 2023-08-28 ENCOUNTER — Emergency Department (HOSPITAL_COMMUNITY)
Admission: EM | Admit: 2023-08-28 | Discharge: 2023-08-28 | Disposition: A | Discharge status: Home / Self Care | Attending: Emergency Medicine | Admitting: Emergency Medicine

## 2023-08-28 DIAGNOSIS — S199XXA Unspecified injury of neck, initial encounter: Secondary | ICD-10-CM | POA: Diagnosis not present

## 2023-08-28 DIAGNOSIS — R000 Tachycardia, unspecified: Secondary | ICD-10-CM | POA: Diagnosis not present

## 2023-08-28 DIAGNOSIS — I10 Essential (primary) hypertension: Secondary | ICD-10-CM | POA: Diagnosis not present

## 2023-08-28 DIAGNOSIS — Y92410 Unspecified street and highway as the place of occurrence of the external cause: Secondary | ICD-10-CM | POA: Diagnosis not present

## 2023-08-28 DIAGNOSIS — R079 Chest pain, unspecified: Secondary | ICD-10-CM | POA: Diagnosis not present

## 2023-08-28 DIAGNOSIS — I6522 Occlusion and stenosis of left carotid artery: Secondary | ICD-10-CM | POA: Diagnosis not present

## 2023-08-28 DIAGNOSIS — M542 Cervicalgia: Secondary | ICD-10-CM

## 2023-08-28 DIAGNOSIS — R0789 Other chest pain: Secondary | ICD-10-CM | POA: Diagnosis not present

## 2023-08-28 DIAGNOSIS — M419 Scoliosis, unspecified: Secondary | ICD-10-CM | POA: Diagnosis not present

## 2023-08-28 DIAGNOSIS — M4312 Spondylolisthesis, cervical region: Secondary | ICD-10-CM | POA: Diagnosis not present

## 2023-08-28 DIAGNOSIS — Z79899 Other long term (current) drug therapy: Secondary | ICD-10-CM | POA: Insufficient documentation

## 2023-08-28 MED ORDER — METHOCARBAMOL 500 MG PO TABS: 500.0000 mg | ORAL_TABLET | Freq: Two times a day (BID) | ORAL | 0 refills | Status: DC

## 2023-08-28 MED ORDER — MELOXICAM 7.5 MG PO TABS: 7.5000 mg | ORAL_TABLET | Freq: Every day | ORAL | 0 refills | Status: DC

## 2023-08-28 MED ORDER — LIDOCAINE 5 % EX PTCH
1.0000 | MEDICATED_PATCH | CUTANEOUS | Status: DC
  Administered 2023-08-28: 1 via TRANSDERMAL
  Filled 2023-08-28: qty 1

## 2023-08-28 MED ORDER — HYDROCODONE-ACETAMINOPHEN 5-325 MG PO TABS
1.0000 | ORAL_TABLET | Freq: Once | ORAL | Status: AC
  Administered 2023-08-28: 1 via ORAL
  Filled 2023-08-28: qty 1
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
## 2023-08-28 NOTE — Discharge Instructions (Signed)
 Thank you for letting us evaluate you today.  Your CT of your neck was negative for acute fracture.  Your chest x-ray was negative for fracture.  Your pain is likely secondary to a contusion, bruising, muscle pain.  I have sent meloxicam and Robaxin to your pharmacy to use for pain and muscle relaxation respectively.Robaxin may cause drowsiness so do not operate heavy machinery including driving or drink alcohol with this.  You may take this at night or split the tablet in half if it makes you too drowsy.  You may take Tylenol in addition to meloxicam but do not take Advil, aspirin, ibuprofen, Aleve as they are all in the same family.  Return to ED if significant worsening of symptoms, altered mentation, confusion
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
## 2023-08-28 NOTE — ED Notes (Signed)
 Patient taken to CT.

## 2023-08-31 ENCOUNTER — Telehealth: Payer: Self-pay

## 2023-08-31 NOTE — Transitions of Care (Post Inpatient/ED Visit) (Signed)
   08/31/2023  Name: Lindsey Carlson MRN: [id number] DOB: [birth year]  Today's TOC FU Call Status: Today's TOC FU Call Status:: Successful TOC FU Call Completed TOC FU Call Complete Date: 08/31/23 Patient's Name and Date of Birth confirmed.  Transition Care Management Follow-up Telephone Call Date of Discharge: 08/28/23 Discharge Facility: [facility]) Type of Discharge: Emergency Department How have you been since you were released from the hospital?: Better Any questions or concerns?: No  Items Reviewed: Did you receive and understand the discharge instructions provided?: Yes Medications obtained,verified, and reconciled?: Yes (Medications Reviewed) Any new allergies since your discharge?: No Dietary orders reviewed?: NA Do you have support at home?: No  Medications Reviewed Today: Medications Reviewed Today     Reviewed by Trudee Kuster, CMA (Certified Medical Assistant) on 08/31/23 at 1133  Med List Status: <None>   Medication Order Taking? Sig Documenting Provider Last Dose Status Informant  amLODipine (NORVASC) 10 MG tablet [id number] Yes Take 1 tablet (10 mg total) by mouth daily. Anne Ng, NP Taking Active   meloxicam (MOBIC) 7.5 MG tablet [id number] Yes Take 1 tablet (7.5 mg total) by mouth daily. Judithann Sheen, PA Taking Active   methocarbamol (ROBAXIN) 500 MG tablet [id number] Yes Take 1 tablet (500 mg total) by mouth 2 (two) times daily. Judithann Sheen, PA Taking Active             Home Care and Equipment/Supplies: Were Home Health Services Ordered?: NA Any new equipment or medical supplies ordered?: NA  Functional Questionnaire: Do you need assistance with bathing/showering or dressing?: No Do you need assistance with meal preparation?: No Do you need assistance with eating?: No Do you have difficulty maintaining continence: No Do you need assistance with getting out of bed/getting out of a chair/moving?: No Do you have difficulty managing or  taking your medications?: No  Follow up appointments reviewed: PCP Follow-up appointment confirmed?: No MD Provider Line Number:[phone] Given: No Specialist Hospital Follow-up appointment confirmed?: NA Do you need transportation to your follow-up appointment?: No Do you understand care options if your condition(s) worsen?: Yes-patient verbalized understanding   Cherylene Ferrufino C, RMA

## 2023-09-23 ENCOUNTER — Ambulatory Visit
Admission: RE | Admit: 2023-09-23 | Discharge: 2023-09-23 | Disposition: A | Discharge status: Home / Self Care | Source: Ambulatory Visit | Attending: Family Medicine | Admitting: Family Medicine

## 2023-09-23 VITALS — BP 103/71 | HR 80 | Temp 98.5°F | Resp 17

## 2023-09-23 DIAGNOSIS — N760 Acute vaginitis: Secondary | ICD-10-CM | POA: Insufficient documentation

## 2023-09-23 MED ORDER — METRONIDAZOLE 500 MG PO TABS: 500.0000 mg | ORAL_TABLET | Freq: Two times a day (BID) | ORAL | 0 refills | Status: AC

## 2023-09-23 MED ORDER — FLUCONAZOLE 150 MG PO TABS: 150.0000 mg | ORAL_TABLET | ORAL | 0 refills | Status: AC

## 2023-09-23 NOTE — Discharge Instructions (Signed)
 Staff will notify you if there is anything positive on the swab.  Take fluconazole 150 mg--1 tablet every 3 days for 3 doses  Take metronidazole 500 mg--1 tablet 2 times daily for 7 days.  Avoid drinking alcohol within 72 hours of taking this medication

## 2023-09-23 NOTE — ED Triage Notes (Signed)
 Pt presents with c/o vaginal itching. Has a hx of various yeast infections last year. Pt used Monistat and has not had any relief. Pt states she used a honey pot wash before applying monistat. Reports pain that night and today does not have pain.
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
## 2023-09-26 ENCOUNTER — Encounter: Admitting: Nurse Practitioner

## 2023-09-26 LAB — CERVICOVAGINAL ANCILLARY ONLY
Bacterial Vaginitis (gardnerella): POSITIVE — AB
Candida Glabrata: NEGATIVE
Candida Vaginitis: POSITIVE — AB
Chlamydia: NEGATIVE
Comment: NEGATIVE
Comment: NEGATIVE
Comment: NEGATIVE
Comment: NEGATIVE
Comment: NEGATIVE
Comment: NORMAL
Neisseria Gonorrhea: NEGATIVE
Trichomonas: NEGATIVE

## 2023-11-22 ENCOUNTER — Ambulatory Visit: Payer: Self-pay | Admitting: Nurse Practitioner

## 2023-11-22 ENCOUNTER — Ambulatory Visit (INDEPENDENT_AMBULATORY_CARE_PROVIDER_SITE_OTHER): Admitting: Nurse Practitioner

## 2023-11-22 ENCOUNTER — Encounter: Payer: Self-pay | Admitting: Nurse Practitioner

## 2023-11-22 VITALS — BP 110/72 | HR 80 | Temp 97.4°F | Ht 61.0 in | Wt 134.8 lb

## 2023-11-22 DIAGNOSIS — D500 Iron deficiency anemia secondary to blood loss (chronic): Secondary | ICD-10-CM

## 2023-11-22 DIAGNOSIS — R5383 Other fatigue: Secondary | ICD-10-CM | POA: Diagnosis not present

## 2023-11-22 LAB — CBC
HCT: 32.7 % — ABNORMAL LOW (ref 36.0–46.0)
Hemoglobin: 10.3 g/dL — ABNORMAL LOW (ref 12.0–15.0)
MCHC: 31.4 g/dL (ref 30.0–36.0)
MCV: 73.3 fl — ABNORMAL LOW (ref 78.0–100.0)
Platelets: 344 10*3/uL (ref 150.0–400.0)
RBC: 4.47 Mil/uL (ref 3.87–5.11)
RDW: 17.7 % — ABNORMAL HIGH (ref 11.5–15.5)
WBC: 5 10*3/uL (ref 4.0–10.5)

## 2023-11-22 LAB — TSH: TSH: 2.23 u[IU]/mL (ref 0.35–5.50)

## 2023-11-22 LAB — IBC + FERRITIN
Ferritin: 5.5 ng/mL — ABNORMAL LOW (ref 10.0–291.0)
Iron: 29 ug/dL — ABNORMAL LOW (ref 42–145)
Saturation Ratios: 6.1 % — ABNORMAL LOW (ref 20.0–50.0)
TIBC: 474.6 ug/dL — ABNORMAL HIGH (ref 250.0–450.0)
Transferrin: 339 mg/dL (ref 212.0–360.0)

## 2023-11-22 LAB — POCT URINALYSIS DIPSTICK
Bilirubin, UA: NEGATIVE
Glucose, UA: NEGATIVE
Ketones, UA: NEGATIVE
Leukocytes, UA: NEGATIVE
Nitrite, UA: NEGATIVE
Protein, UA: NEGATIVE
Spec Grav, UA: 1.015 (ref 1.010–1.025)
Urobilinogen, UA: NEGATIVE U/dL — AB
pH, UA: 7 (ref 5.0–8.0)

## 2023-11-22 LAB — VITAMIN D 25 HYDROXY (VIT D DEFICIENCY, FRACTURES): VITD: 18.58 ng/mL — ABNORMAL LOW (ref 30.00–100.00)

## 2023-11-22 NOTE — Assessment & Plan Note (Addendum)
 Acute on chronic, worse in last 3weeks ago, Despite exercise (daily- cardio and weight training) and sleep (9pm  to 4am) She did not f/up with rheumatology, opted not to continue plaquenil  due to side effects. Declined uterine ablation and use of HRT to manage menorrhagia. Denies any URI or fever or GU/GI or vaginal symptoms or joint/muscle pain or rash or swollen lymph nodes She is Sexually active, no condoms, s/p tubal ligation. LMP 11/17/2023.  Suspect iron  deficiency vs autoimmune disorder Check CBC, iron , THYROID , vit. D, hiv, and UA today

## 2023-11-22 NOTE — Progress Notes (Signed)
 Established Patient Visit  Patient: Lindsey Carlson   DOB: [birth year]   43 y.o. Female  MRN: [id number] Visit Date: 11/22/2023  Subjective:     Chief Complaint  Patient presents with   Fatigue    No energy, and exhaustion for 3-4 weeks-"getting enough sleep and working out still feeling tired"   HPI Other fatigue Acute on chronic, worse in last 3weeks ago, Despite exercise (daily- cardio and weight training) and sleep (9pm  to 4am) She did not f/up with rheumatology, opted not to continue plaquenil  due to side effects. Declined uterine ablation and use of HRT to manage menorrhagia. Denies any URI or fever or GU/GI or vaginal symptoms or joint/muscle pain or rash or swollen lymph nodes She is Sexually active, no condoms, s/p tubal ligation. LMP 11/17/2023.  Suspect iron  deficiency vs autoimmune disorder Check CBC, iron , THYROID , vit. D, hiv, and UA today  Reviewed medical, surgical, and social history today  Medications: Outpatient Medications Prior to Visit  Medication Sig   amLODipine  (NORVASC ) 10 MG tablet Take 1 tablet (10 mg total) by mouth daily.   [DISCONTINUED] meloxicam  (MOBIC ) 7.5 MG tablet Take 1 tablet (7.5 mg total) by mouth daily. (Patient not taking: Reported on 11/22/2023)   [DISCONTINUED] methocarbamol  (ROBAXIN ) 500 MG tablet Take 1 tablet (500 mg total) by mouth 2 (two) times daily. (Patient not taking: Reported on 11/22/2023)   No facility-administered medications prior to visit.   Reviewed past medical and social history.   ROS per HPI above  Last CBC Lab Results  Component Value Date   WBC 7.9 01/31/2023   HGB 12.3 01/31/2023   HCT 37.5 01/31/2023   MCV 83.7 01/31/2023   MCH 27.5 01/31/2023   RDW 13.2 01/31/2023   PLT 369 01/31/2023   Last metabolic panel Lab Results  Component Value Date   GLUCOSE 102 (H) 01/31/2023   NA 135 01/31/2023   K 3.3 (L) 01/31/2023   CL 102 01/31/2023   CO2 24 01/31/2023   BUN 9 01/31/2023   CREATININE  0.71 01/31/2023   GFRNONAA >60 01/31/2023   CALCIUM 9.2 01/31/2023   PROT 7.7 09/24/2022   ALBUMIN 4.3 09/24/2022   BILITOT 0.2 09/24/2022   ALKPHOS 76 09/24/2022   AST 16 09/24/2022   ALT 13 09/24/2022   ANIONGAP 9 01/31/2023   Last thyroid  functions Lab Results  Component Value Date   TSH 2.45 03/10/2021   Last vitamin D  Lab Results  Component Value Date   VD25OH 20.00 (L) 06/28/2022   Last vitamin B12 and Folate Lab Results  Component Value Date   VITAMINB12 330 10/18/2022   FOLATE >24.1 04/24/2019        Objective:  BP 110/72 (BP Location: Left Arm, Patient Position: Sitting, Cuff Size: Normal)   Pulse 80   Temp (!) 97.4 F (36.3 C) (Temporal)   Ht 5\' 1"  (1.549 m)   Wt 134 lb 12.8 oz (61.1 kg)   LMP 11/17/2023   SpO2 100%   BMI 25.47 kg/m      Physical Exam HENT:     Head: Normocephalic.     Jaw: There is normal jaw occlusion.     Salivary Glands: Right salivary gland is not diffusely enlarged or tender. Left salivary gland is not diffusely enlarged or tender.     Right Ear: Tympanic membrane, ear canal and external ear normal.     Left Ear: Tympanic membrane, ear  canal and external ear normal.     Nose: Nose normal.     Right Sinus: No maxillary sinus tenderness or frontal sinus tenderness.     Left Sinus: No maxillary sinus tenderness or frontal sinus tenderness.     Mouth/Throat:     Mouth: Mucous membranes are moist.     Palate: No mass and lesions.     Pharynx: Oropharynx is clear. Uvula midline.  Cardiovascular:     Rate and Rhythm: Normal rate and regular rhythm.     Pulses: Normal pulses.     Heart sounds: Normal heart sounds.  Pulmonary:     Effort: Pulmonary effort is normal.     Breath sounds: Normal breath sounds.  Abdominal:     General: Bowel sounds are normal.     Palpations: Abdomen is soft.  Musculoskeletal:     Cervical back: Normal range of motion and neck supple.     Right lower leg: No edema.     Left lower leg: No edema.   Lymphadenopathy:     Cervical: No cervical adenopathy.  Skin:    General: Skin is warm and dry.     Findings: No rash.  Neurological:     Mental Status: She is oriented to person, place, and time.  Psychiatric:        Mood and Affect: Mood normal.     No results found for any visits on 11/22/23.    Assessment & Plan:    Problem List Items Addressed This Visit     Iron  deficiency anemia due to chronic blood loss - Primary   Relevant Orders   CBC   IBC + Ferritin   Other fatigue   Acute on chronic, worse in last 3weeks ago, Despite exercise (daily- cardio and weight training) and sleep (9pm  to 4am) She did not f/up with rheumatology, opted not to continue plaquenil  due to side effects. Declined uterine ablation and use of HRT to manage menorrhagia. Denies any URI or fever or GU/GI or vaginal symptoms or joint/muscle pain or rash or swollen lymph nodes She is Sexually active, no condoms, s/p tubal ligation. LMP 11/17/2023.  Suspect iron  deficiency vs autoimmune disorder Check CBC, iron , THYROID , vit. D, hiv, and UA today      Relevant Orders   TSH   VITAMIN D  25 Hydroxy (Vit-D Deficiency, Fractures)   HIV Antibody (routine testing w rflx)   POCT urinalysis dipstick   Return if symptoms worsen or fail to improve.     Kathrene Parents, NP

## 2023-11-22 NOTE — Patient Instructions (Signed)
 Go to lab Maintain Heart healthy diet and daily exercise. Maintain current medications.

## 2023-11-23 LAB — HIV ANTIBODY (ROUTINE TESTING W REFLEX): HIV 1&2 Ab, 4th Generation: NONREACTIVE

## 2023-12-02 ENCOUNTER — Encounter: Payer: Self-pay | Admitting: Hematology and Oncology

## 2023-12-02 ENCOUNTER — Inpatient Hospital Stay: Attending: Hematology and Oncology | Admitting: Hematology and Oncology

## 2023-12-02 VITALS — BP 110/76 | HR 79 | Temp 98.2°F | Resp 18 | Ht 61.0 in | Wt 136.4 lb

## 2023-12-02 DIAGNOSIS — N920 Excessive and frequent menstruation with regular cycle: Secondary | ICD-10-CM | POA: Diagnosis not present

## 2023-12-02 DIAGNOSIS — D500 Iron deficiency anemia secondary to blood loss (chronic): Secondary | ICD-10-CM | POA: Insufficient documentation

## 2023-12-02 NOTE — Assessment & Plan Note (Addendum)
 She responded well to intravenous iron  infusion in the past, last infusion was in May She was noted to become anemic again and she is symptomatic Previously, we discussed the role of Lupron  injection but the patient declined She had nausea after intravenous iron  sucrose I recommend switching her treatment to intravenous iron  Feraheme with premedications and she agreed to proceed She declined infusion in [employer]. and wants to return here for treatment I will schedule 2 doses of intravenous iron  Feraheme and we will see her again in 9 months for further follow-up

## 2023-12-02 NOTE — Assessment & Plan Note (Addendum)
 I have ordered blood work to screen for von Willebrand disease and they came back normal She declined Lupron  injection or hormonal manipulation

## 2023-12-02 NOTE — Progress Notes (Signed)
 [facility] OFFICE PROGRESS NOTE  Carlson, Lindsey Delaine, NP  ASSESSMENT & PLAN:  Assessment & Plan Iron  deficiency anemia due to chronic blood loss She responded well to intravenous iron  infusion in the past, last infusion was in May She was noted to become anemic again and she is symptomatic Previously, we discussed the role of Lupron  injection but the patient declined She had nausea after intravenous iron  sucrose I recommend switching her treatment to intravenous iron  Feraheme with premedications and she agreed to proceed She declined infusion in [employer]. and wants to return here for treatment I will schedule 2 doses of intravenous iron  Feraheme and we will see her again in 9 months for further follow-up Menorrhagia with regular cycle I have ordered blood work to screen for von Willebrand disease and they came back normal She declined Lupron  injection or hormonal manipulation    Orders Placed This Encounter  Procedures   CBC with Differential (Cancer Center Only)    Standing Status:   Future    Expiration Date:   12/01/2024   Ferritin    Standing Status:   Future    Expiration Date:   12/01/2024   Iron  and Iron  Binding Capacity ([facility] only)    Standing Status:   Future    Expiration Date:   12/01/2024    INTERVAL HISTORY: Patient returns for recurrent anemia Symptoms of anemia includes fatigue, pallor, and cold intolerance I reviewed her blood work recently On November 22, 2023, CBC showed hemoglobin of 10.3, MCV was 73.3 The patient denies any recent signs or symptoms of bleeding such as spontaneous epistaxis, hematuria or hematochezia. She complained of heavy menstruation She started craving ice a month ago  SUMMARY OF HEMATOLOGIC HISTORY: She was found to have abnormal CBC from recent blood draw I have had opportunity to review his CBC dated back to many years In the past, she is noted to have iron  deficiency anemia Most recently, her doctor ordered  an iron  panel but did not see a CBC drawn until I ordered it today She stated she has been anemic her whole life She denies recent chest pain on exertion, shortness of breath on minimal exertion, pre-syncopal episodes, or palpitations. She does complain of excessive fatigue and intermittent leg cramps She had not noticed any recent bleeding such as epistaxis, hematuria or hematochezia The patient denies over the counter NSAID ingestion. She is not on antiplatelets agents. She has not have EGD or colonoscopy evaluation in the past She had been diagnosed with rheumatoid arthritis but is not receiving active treatment She had no prior history or diagnosis of cancer. Her age appropriate screening programs are up-to-date. She has some pica and eats a variety of diet. She never donated blood or received blood transfusion The patient was prescribed oral iron  supplements and she takes daily for many years.  She also had history of B12 deficiency and had been on B12 injection for some time but none recently Interestingly, her daughter with heavy menorrhagia was diagnosed with von Willebrand disease but she has never been tested She had multiple surgeries in the past but never required transfusion support after surgery She had excessive menstrual bleeding every month, on average, her menstrual cycle last 7 days associated with significant bleeding. She received 1 dose of intravenous iron  sucrose in May 2024 complicated by nausea after treatment  Vitals:   12/02/23 1159  BP: 110/76  Pulse: 79  Resp: 18  Temp: 98.2 F (36.8 C)  SpO2: 100%

## 2023-12-05 ENCOUNTER — Encounter: Admitting: Nurse Practitioner

## 2023-12-09 ENCOUNTER — Inpatient Hospital Stay

## 2023-12-09 VITALS — BP 102/66 | HR 88 | Temp 99.2°F | Resp 18

## 2023-12-09 DIAGNOSIS — D500 Iron deficiency anemia secondary to blood loss (chronic): Secondary | ICD-10-CM

## 2023-12-09 DIAGNOSIS — N920 Excessive and frequent menstruation with regular cycle: Secondary | ICD-10-CM | POA: Diagnosis not present

## 2023-12-09 MED ORDER — ACETAMINOPHEN 325 MG PO TABS
650.0000 mg | ORAL_TABLET | Freq: Once | ORAL | Status: AC
  Administered 2023-12-09: 650 mg via ORAL
  Filled 2023-12-09: qty 2

## 2023-12-09 MED ORDER — LORATADINE 10 MG PO TABS
10.0000 mg | ORAL_TABLET | Freq: Once | ORAL | Status: AC
  Administered 2023-12-09: 10 mg via ORAL
  Filled 2023-12-09: qty 1

## 2023-12-09 MED ORDER — CETIRIZINE HCL 10 MG PO TABS
10.0000 mg | ORAL_TABLET | Freq: Once | ORAL | Status: AC
  Administered 2023-12-09: 10 mg via ORAL
  Filled 2023-12-09: qty 1

## 2023-12-09 MED ORDER — SODIUM CHLORIDE 0.9 % IV SOLN: INTRAVENOUS | Status: DC

## 2023-12-09 MED ORDER — SODIUM CHLORIDE 0.9 % IV SOLN
510.0000 mg | Freq: Once | INTRAVENOUS | Status: AC
  Administered 2023-12-09: 510 mg via INTRAVENOUS
  Filled 2023-12-09: qty 510

## 2023-12-09 NOTE — Patient Instructions (Signed)

## 2023-12-15 ENCOUNTER — Encounter: Payer: Self-pay | Admitting: Hematology and Oncology

## 2023-12-16 ENCOUNTER — Encounter: Payer: Self-pay | Admitting: Hematology and Oncology

## 2023-12-16 ENCOUNTER — Inpatient Hospital Stay

## 2023-12-16 VITALS — BP 124/78 | HR 73 | Temp 98.2°F | Resp 16

## 2023-12-16 DIAGNOSIS — N920 Excessive and frequent menstruation with regular cycle: Secondary | ICD-10-CM | POA: Diagnosis not present

## 2023-12-16 DIAGNOSIS — D500 Iron deficiency anemia secondary to blood loss (chronic): Secondary | ICD-10-CM

## 2023-12-16 MED ORDER — CETIRIZINE HCL 10 MG PO TABS
10.0000 mg | ORAL_TABLET | Freq: Once | ORAL | Status: AC
  Administered 2023-12-16: 10 mg via ORAL
  Filled 2023-12-16: qty 1

## 2023-12-16 MED ORDER — LORATADINE 10 MG PO TABS
10.0000 mg | ORAL_TABLET | Freq: Once | ORAL | Status: AC
  Administered 2023-12-16: 10 mg via ORAL
  Filled 2023-12-16: qty 1

## 2023-12-16 MED ORDER — SODIUM CHLORIDE 0.9 % IV SOLN
510.0000 mg | Freq: Once | INTRAVENOUS | Status: AC
  Administered 2023-12-16: 510 mg via INTRAVENOUS
  Filled 2023-12-16: qty 510

## 2023-12-16 MED ORDER — ACETAMINOPHEN 325 MG PO TABS
650.0000 mg | ORAL_TABLET | Freq: Once | ORAL | Status: AC
  Administered 2023-12-16: 650 mg via ORAL
  Filled 2023-12-16: qty 2

## 2023-12-16 MED ORDER — SODIUM CHLORIDE 0.9 % IV SOLN: INTRAVENOUS | Status: DC

## 2023-12-30 ENCOUNTER — Ambulatory Visit: Admitting: Nurse Practitioner

## 2024-01-20 ENCOUNTER — Encounter: Payer: Self-pay | Admitting: Hematology and Oncology
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
## 2024-01-25 ENCOUNTER — Ambulatory Visit: Attending: Internal Medicine | Admitting: Internal Medicine

## 2024-01-25 ENCOUNTER — Encounter: Payer: Self-pay | Admitting: Internal Medicine

## 2024-01-25 VITALS — BP 117/76 | HR 71 | Resp 16 | Ht 61.0 in | Wt 138.2 lb

## 2024-01-25 DIAGNOSIS — M722 Plantar fascial fibromatosis: Secondary | ICD-10-CM

## 2024-01-25 DIAGNOSIS — L811 Chloasma: Secondary | ICD-10-CM

## 2024-01-25 DIAGNOSIS — M21612 Bunion of left foot: Secondary | ICD-10-CM

## 2024-01-25 DIAGNOSIS — M21611 Bunion of right foot: Secondary | ICD-10-CM

## 2024-01-25 DIAGNOSIS — M069 Rheumatoid arthritis, unspecified: Secondary | ICD-10-CM

## 2024-01-25 MED ORDER — CELECOXIB 100 MG PO CAPS: 100.0000 mg | ORAL_CAPSULE | Freq: Two times a day (BID) | ORAL | 2 refills | Status: DC | PRN

## 2024-02-07 ENCOUNTER — Telehealth: Admitting: Emergency Medicine

## 2024-02-07 DIAGNOSIS — R3989 Other symptoms and signs involving the genitourinary system: Secondary | ICD-10-CM | POA: Diagnosis not present

## 2024-02-07 MED ORDER — CEPHALEXIN 500 MG PO CAPS: 500.0000 mg | ORAL_CAPSULE | Freq: Two times a day (BID) | ORAL | 0 refills | Status: AC

## 2024-02-07 NOTE — Progress Notes (Signed)
E-Visit for Urinary Problems ? ?We are sorry that you are not feeling well.  Here is how we plan to help! ? ?Based on what you shared with me it looks like you most likely have a simple urinary tract infection. ? ?A UTI (Urinary Tract Infection) is a bacterial infection of the bladder. ? ?Most cases of urinary tract infections are simple to treat but a key part of your care is to encourage you to drink plenty of fluids and watch your symptoms carefully. ? ?I have prescribed Keflex 500 mg twice a day for 7 days.  Your symptoms should gradually improve. Call us if the burning in your urine worsens, you develop worsening fever, back pain or pelvic pain or if your symptoms do not resolve after completing the antibiotic. ? ?Urinary tract infections can be prevented by drinking plenty of water to keep your body hydrated.  Also be sure when you wipe, wipe from front to back and don't hold it in!  If possible, empty your bladder every 4 hours. ? ?HOME CARE ?Drink plenty of fluids ?Compete the full course of the antibiotics even if the symptoms resolve ?Remember, when you need to go?go. Holding in your urine can increase the likelihood of getting a UTI! ?GET HELP RIGHT AWAY IF: ?You cannot urinate ?You get a high fever ?Worsening back pain occurs ?You see blood in your urine ?You feel sick to your stomach or throw up ?You feel like you are going to pass out ? ?MAKE SURE YOU  ?Understand these instructions. ?Will watch your condition. ?Will get help right away if you are not doing well or get worse. ? ? ?Thank you for choosing an e-visit. ? ?Your e-visit answers were reviewed by a board certified advanced clinical practitioner to complete your personal care plan. Depending upon the condition, your plan could have included both over the counter or prescription medications. ? ?Please review your pharmacy choice. Make sure the pharmacy is open so you can pick up prescription now. If there is a problem, you may contact your  provider through MyChart messaging and have the prescription routed to another pharmacy.  Your safety is important to us. If you have drug allergies check your prescription carefully.  ? ?For the next 24 hours you can use MyChart to ask questions about today's visit, request a non-urgent call back, or ask for a work or school excuse. ?You will get an email in the next two days asking about your experience. I hope that your e-visit has been valuable and will speed your recovery. ? ?I have spent 5 minutes in review of e-visit questionnaire, review and updating patient chart, medical decision making and response to patient.  ? ?Regena Delucchi, PhD, FNP-BC ?  ?

## 2024-03-19 ENCOUNTER — Ambulatory Visit: Admitting: Nurse Practitioner

## 2024-03-19 ENCOUNTER — Telehealth: Payer: Self-pay

## 2024-03-19 NOTE — Telephone Encounter (Signed)
 Called patient and left a detailed voice message per DPR on file about her virtual appointment detailing that I have called her multiple times and left voice messages. Asked that she give the office a call to schedule an in office appointment to have her rash evaluated in person. I stated that she can also schedule via MyChart as an in person appointment. I also sent a MyChart message to patient.

## 2024-03-19 NOTE — Telephone Encounter (Signed)
 Left voice message for patient

## 2024-03-19 NOTE — Telephone Encounter (Signed)
 Called patient who is scheduled for a virtual visit with Roselie Mood today at 11:20 to get the visit prepped and review medications. Asked patient to give me a call back at the office at [phone] per DPR on file.

## 2024-03-24 ENCOUNTER — Ambulatory Visit
Admission: RE | Admit: 2024-03-24 | Discharge: 2024-03-24 | Disposition: A | Discharge status: Home / Self Care | Attending: Physician Assistant | Admitting: Physician Assistant

## 2024-03-24 VITALS — BP 113/76 | HR 88 | Temp 98.0°F | Resp 18

## 2024-03-24 DIAGNOSIS — S0501XA Injury of conjunctiva and corneal abrasion without foreign body, right eye, initial encounter: Secondary | ICD-10-CM | POA: Diagnosis not present

## 2024-03-24 MED ORDER — ERYTHROMYCIN 5 MG/GM OP OINT: TOPICAL_OINTMENT | Freq: Four times a day (QID) | OPHTHALMIC | 0 refills | Status: AC
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
## 2024-03-24 NOTE — Discharge Instructions (Addendum)
 You were seen today for concerns of right eye irritation and previous history of a corneal abrasion.  Your exam was notable for an abrasion along the front of the eye over the iris and pupil. To help with this I am sending in an antibiotic ointment called erythromycin  for you to apply into the right eye 4 times per day for 7 days. You can also use lubricating eyedrops such as blink tears or refresh eyedrops to help with reducing irritation. If you feel like your symptoms are not improving or seem to be worsening I recommend following up with an ophthalmologist immediately to make sure there is not a more severe complication developing.

## 2024-03-24 NOTE — ED Triage Notes (Signed)
 The patient reports experiencing pain and irritation in the right eye, which she attributes to allergies and a previous corneal scratch. She also notes sensitivity to light and blurred vision in the affected eye.  Home interventions: OTC eye drops   Started: this morning

## 2024-04-04 ENCOUNTER — Encounter: Payer: Self-pay | Admitting: Nurse Practitioner

## 2024-05-01 ENCOUNTER — Ambulatory Visit: Admitting: Nurse Practitioner

## 2024-05-02 ENCOUNTER — Ambulatory Visit: Admitting: Nurse Practitioner

## 2024-05-02 ENCOUNTER — Encounter: Payer: Self-pay | Admitting: Nurse Practitioner

## 2024-05-02 ENCOUNTER — Ambulatory Visit

## 2024-05-02 ENCOUNTER — Ambulatory Visit: Payer: Self-pay | Admitting: Nurse Practitioner

## 2024-05-02 VITALS — BP 116/74 | HR 74 | Temp 97.7°F | Ht 61.0 in | Wt 137.6 lb

## 2024-05-02 DIAGNOSIS — K529 Noninfective gastroenteritis and colitis, unspecified: Secondary | ICD-10-CM

## 2024-05-02 DIAGNOSIS — D500 Iron deficiency anemia secondary to blood loss (chronic): Secondary | ICD-10-CM

## 2024-05-02 LAB — CBC WITH DIFFERENTIAL/PLATELET
Basophils Absolute: 0 K/uL (ref 0.0–0.1)
Basophils Relative: 0.9 % (ref 0.0–3.0)
Eosinophils Absolute: 0.1 K/uL (ref 0.0–0.7)
Eosinophils Relative: 2.4 % (ref 0.0–5.0)
HCT: 38.3 % (ref 36.0–46.0)
Hemoglobin: 12.9 g/dL (ref 12.0–15.0)
Lymphocytes Relative: 38 % (ref 12.0–46.0)
Lymphs Abs: 2.1 K/uL (ref 0.7–4.0)
MCHC: 33.6 g/dL (ref 30.0–36.0)
MCV: 85.5 fl (ref 78.0–100.0)
Monocytes Absolute: 0.4 K/uL (ref 0.1–1.0)
Monocytes Relative: 6.7 % (ref 3.0–12.0)
Neutro Abs: 2.9 K/uL (ref 1.4–7.7)
Neutrophils Relative %: 52 % (ref 43.0–77.0)
Platelets: 331 K/uL (ref 150.0–400.0)
RBC: 4.48 Mil/uL (ref 3.87–5.11)
RDW: 13.3 % (ref 11.5–15.5)
WBC: 5.6 K/uL (ref 4.0–10.5)

## 2024-05-02 LAB — BASIC METABOLIC PANEL WITH GFR
BUN: 9 mg/dL (ref 6–23)
CO2: 29 meq/L (ref 19–32)
Calcium: 9.4 mg/dL (ref 8.4–10.5)
Chloride: 100 meq/L (ref 96–112)
Creatinine, Ser: 0.61 mg/dL (ref 0.40–1.20)
GFR: 108.27 mL/min (ref >60.00)
Glucose, Bld: 90 mg/dL (ref 70–99)
Potassium: 3.6 meq/L (ref 3.5–5.1)
Sodium: 134 meq/L — ABNORMAL LOW (ref 135–145)

## 2024-05-02 LAB — POCT URINALYSIS DIPSTICK
Bilirubin, UA: NEGATIVE
Glucose, UA: NEGATIVE
Ketones, UA: NEGATIVE
Leukocytes, UA: NEGATIVE
Nitrite, UA: NEGATIVE
Protein, UA: NEGATIVE
Spec Grav, UA: 1.01 (ref 1.010–1.025)
Urobilinogen, UA: NEGATIVE U/dL — AB
pH, UA: 6 (ref 5.0–8.0)

## 2024-05-02 LAB — IBC + FERRITIN
Ferritin: 11 ng/mL (ref 10.0–291.0)
Iron: 39 ug/dL — ABNORMAL LOW (ref 42–145)
Saturation Ratios: 9.5 % — ABNORMAL LOW (ref 20.0–50.0)
TIBC: 410.2 ug/dL (ref 250.0–450.0)
Transferrin: 293 mg/dL (ref 212.0–360.0)

## 2024-05-02 LAB — POCT INFLUENZA A/B
Influenza A, POC: NEGATIVE
Influenza B, POC: NEGATIVE

## 2024-05-02 LAB — POC COVID19 BINAXNOW: SARS Coronavirus 2 Ag: NEGATIVE

## 2024-05-02 MED ORDER — ONDANSETRON HCL 4 MG PO TABS: 4.0000 mg | ORAL_TABLET | Freq: Three times a day (TID) | ORAL | 0 refills | Status: DC | PRN

## 2024-05-02 NOTE — Progress Notes (Signed)
 Acute Office Visit  Subjective:    Patient ID: Lindsey Carlson, female    DOB: Dec 09, 1980, 42 y.o.   MRN: [id number]  Chief Complaint  Patient presents with   Fatigue    Fatigue/exhaustion has worsen, had nausea/vomiting yesterday and headaches    GI Problem The primary symptoms include fatigue, nausea, vomiting, diarrhea and myalgias. Primary symptoms do not include fever, weight loss, abdominal pain, melena, hematemesis, jaundice, hematochezia, dysuria, arthralgias or rash. The illness began yesterday. The onset was sudden. The problem has not changed since onset. The illness is also significant for anorexia. The illness does not include chills, dysphagia, odynophagia, bloating, constipation, tenesmus, back pain or itching.   Outpatient Medications Prior to Visit  Medication Sig   amLODipine  (NORVASC ) 10 MG tablet Take 1 tablet (10 mg total) by mouth daily.   celecoxib  (CELEBREX ) 100 MG capsule Take 1 capsule (100 mg total) by mouth 2 (two) times daily as needed. (Patient not taking: Reported on 05/02/2024)   No facility-administered medications prior to visit.   Reviewed past medical and social history.  Review of Systems  Constitutional:  Positive for fatigue. Negative for chills, fever and weight loss.  Gastrointestinal:  Positive for anorexia, diarrhea, nausea and vomiting. Negative for abdominal pain, bloating, constipation, dysphagia, hematemesis, hematochezia, jaundice and melena.  Genitourinary:  Negative for dysuria.  Musculoskeletal:  Positive for myalgias. Negative for arthralgias and back pain.  Skin:  Negative for itching and rash.   Per HPI     Objective:    Physical Exam Vitals and nursing note reviewed.  Constitutional:      Appearance: She is ill-appearing.  Cardiovascular:     Rate and Rhythm: Normal rate and regular rhythm.     Pulses: Normal pulses.     Heart sounds: Normal heart sounds.  Pulmonary:     Effort: Pulmonary effort is normal.     Breath  sounds: Normal breath sounds.  Abdominal:     General: Bowel sounds are normal. There is no distension.     Palpations: Abdomen is soft.     Tenderness: There is no abdominal tenderness. There is no guarding.  Musculoskeletal:     Cervical back: Normal range of motion.     Right lower leg: No edema.     Left lower leg: No edema.  Lymphadenopathy:     Cervical: No cervical adenopathy.  Skin:    Findings: No rash.  Neurological:     Mental Status: She is alert and oriented to person, place, and time.    BP 116/74 (BP Location: Left Arm, Patient Position: Sitting, Cuff Size: Large)   Pulse 74   Temp 97.7 F (36.5 C) (Oral)   Ht 5' 1 (1.549 m)   Wt 137 lb 9.6 oz (62.4 kg) Comment: Patient wearing boots that she did not want to remove  LMP 04/27/2024   SpO2 99%   BMI 26.00 kg/m    Results for orders placed or performed in visit on 05/02/24  IBC + Ferritin  Result Value Ref Range   Iron  39 (L) 42 - 145 ug/dL   Transferrin 706.9 787.9 - 360.0 mg/dL   Saturation Ratios 9.5 (L) 20.0 - 50.0 %   Ferritin 11.0 10.0 - 291.0 ng/mL   TIBC 410.2 250.0 - 450.0 mcg/dL  CBC with Differential/Platelet  Result Value Ref Range   WBC 5.6 4.0 - 10.5 K/uL   RBC 4.48 3.87 - 5.11 Mil/uL   Hemoglobin 12.9 12.0 - 15.0 g/dL  HCT 38.3 36.0 - 46.0 %   MCV 85.5 78.0 - 100.0 fl   MCHC 33.6 30.0 - 36.0 g/dL   RDW 86.6 88.4 - 84.4 %   Platelets 331.0 150.0 - 400.0 K/uL   Neutrophils Relative % 52.0 43.0 - 77.0 %   Lymphocytes Relative 38.0 12.0 - 46.0 %   Monocytes Relative 6.7 3.0 - 12.0 %   Eosinophils Relative 2.4 0.0 - 5.0 %   Basophils Relative 0.9 0.0 - 3.0 %   Neutro Abs 2.9 1.4 - 7.7 K/uL   Lymphs Abs 2.1 0.7 - 4.0 K/uL   Monocytes Absolute 0.4 0.1 - 1.0 K/uL   Eosinophils Absolute 0.1 0.0 - 0.7 K/uL   Basophils Absolute 0.0 0.0 - 0.1 K/uL  Basic metabolic panel with GFR  Result Value Ref Range   Sodium 134 (L) 135 - 145 mEq/L   Potassium 3.6 3.5 - 5.1 mEq/L   Chloride 100 96 - 112  mEq/L   CO2 29 19 - 32 mEq/L   Glucose, Bld 90 70 - 99 mg/dL   BUN 9 6 - 23 mg/dL   Creatinine, Ser 9.38 0.40 - 1.20 mg/dL   GFR 891.72 >39.99 mL/min   Calcium 9.4 8.4 - 10.5 mg/dL  POCT Influenza A/B  Result Value Ref Range   Influenza A, POC Negative Negative   Influenza B, POC Negative Negative  POC COVID-19  Result Value Ref Range   SARS Coronavirus 2 Ag Negative Negative  POCT urinalysis dipstick  Result Value Ref Range   Color, UA Yellow    Clarity, UA Cloudy    Glucose, UA Negative Negative   Bilirubin, UA Negative    Ketones, UA Negative    Spec Grav, UA 1.010 1.010 - 1.025   Blood, UA 3+    pH, UA 6.0 5.0 - 8.0   Protein, UA Negative Negative   Urobilinogen, UA negative (A) 0.2 or 1.0 E.U./dL   Nitrite, UA Negative    Leukocytes, UA Negative Negative   Appearance     Odor        Assessment & Plan:   Problem List Items Addressed This Visit     Iron  deficiency anemia due to chronic blood loss   Relevant Orders   IBC + Ferritin (Completed)   CBC with Differential/Platelet (Completed)   Other Visit Diagnoses       Gastroenteritis    -  Primary   Relevant Medications   ondansetron  (ZOFRAN ) 4 MG tablet   Other Relevant Orders   POCT Influenza A/B (Completed)   POC COVID-19 (Completed)   Basic metabolic panel with GFR (Completed)   POCT urinalysis dipstick (Completed)      Meds ordered this encounter  Medications   ondansetron  (ZOFRAN ) 4 MG tablet    Sig: Take 1 tablet (4 mg total) by mouth every 8 (eight) hours as needed for nausea or vomiting.    Dispense:  21 tablet    Refill:  0    Supervising Provider:   BERNETA ELSIE SAYRE [[id number]]   Return if symptoms worsen or fail to improve.    Roselie Mood, NP

## 2024-05-02 NOTE — Patient Instructions (Addendum)
 Maintain a BRAT diet and adequate oral hydration Use zofran  for nausea Go to lab Schedule another appointment if no improvement in 1week  Viral Gastroenteritis, Adult  Viral gastroenteritis is also known as the stomach flu. This condition may affect your stomach, your small intestine, and your large intestine. It can cause sudden watery poop (diarrhea), fever, and vomiting. This condition is caused by certain germs (viruses). These germs can be passed from person to person very easily (are contagious). Having watery poop and vomiting can make you feel weak and cause you to not have enough water in your body (get dehydrated). This can make you tired and thirsty, make you have a dry mouth, and make it so you pee (urinate) less often. It is important to replace the fluids that you lose from having watery poop and vomiting. What are the causes? You can get sick by catching germs from other people. You can also get sick by: Eating food, drinking water, or touching a surface that has the germs on it (is contaminated). Sharing utensils or other personal items with a person who is sick. What increases the risk? Having a weak body defense system (immune system). Living with one or more children who are younger than 2 years. Living in a nursing home. Going on cruise ships. What are the signs or symptoms? Symptoms of this condition start suddenly. Symptoms may last for a few days or for as long as a week. Common symptoms include: Watery poop. Vomiting. Other symptoms include: Fever. Headache. Feeling tired (fatigue). Pain in the belly (abdomen). Chills. Feeling weak. Feeling like you may vomit (nauseous). Muscle aches. Not feeling hungry. How is this treated? This condition typically goes away on its own. The focus of treatment is to replace the fluids that you lose. This condition may be treated with: An ORS (oral rehydration solution). This is a drink that helps you replace fluids and  minerals your body lost. It is sold at pharmacies and stores. Medicines to help with your symptoms. Probiotic supplements to reduce symptoms of watery poop. Fluids given through an IV tube, if needed. Older adults and people with other diseases or a weak body defense system are at higher risk for not having enough water in the body. Follow these instructions at home: Eating and drinking  Take an ORS as told by your doctor. Drink clear fluids in small amounts as you are able. Clear fluids include: Water. Ice chips. Fruit juice that has water added to it (is diluted). Low-calorie sports drinks. Drink enough fluid to keep your pee (urine) pale yellow. Eat small amounts of healthy foods every 3-4 hours as you are able. This may include whole grains, fruits, vegetables, lean meats, and yogurt. Avoid fluids that have a lot of sugar or caffeine in them. This includes energy drinks, sports drinks, and soda. Avoid spicy or fatty foods. Avoid alcohol. General instructions  Wash your hands often. This is very important after you have watery poop or you vomit. If you cannot use soap and water, use hand sanitizer. Make sure that all people in your home wash their hands well and often. Take over-the-counter and prescription medicines only as told by your doctor. Rest at home while you get better. Watch your condition for any changes. Take a warm bath to help with any burning or pain from having watery poop. Keep all follow-up visits. Contact a doctor if: You cannot keep fluids down. Your symptoms get worse. You have new symptoms. You feel light-headed or  dizzy. You have muscle cramps. Get help right away if: You have chest pain. You have trouble breathing, or you are breathing very fast. You have a fast heartbeat. You feel very weak or you faint. You have a very bad headache, a stiff neck, or both. You have a rash. You have very bad pain, cramping, or bloating in your belly. Your skin  feels cold and clammy. You feel mixed up (confused). You have pain when you pee. You have signs of not having enough water in the body, such as: Dark pee, hardly any pee, or no pee. Cracked lips. Dry mouth. Sunken eyes. Feeling very sleepy. Feeling weak. You have signs of bleeding, such as: You see blood in your vomit. Your vomit looks like coffee grounds. You have bloody or black poop or poop that looks like tar. These symptoms may be an emergency. Get help right away. Call 911. Do not wait to see if the symptoms will go away. Do not drive yourself to the hospital. Summary Viral gastroenteritis is also known as the stomach flu. This condition can cause sudden watery poop (diarrhea), fever, and vomiting. These germs can be passed from person to person very easily. Take an ORS (oral rehydration solution) as told by your doctor. This is a drink that is sold at pharmacies and stores. Wash your hands often, especially after having watery poop or vomiting. If you cannot use soap and water, use hand sanitizer. This information is not intended to replace advice given to you by your health care provider. Make sure you discuss any questions you have with your health care provider. Document Revised: 04/06/2021 Document Reviewed: 04/06/2021 Elsevier Patient Education  2024 [employer].

## 2024-05-03 ENCOUNTER — Ambulatory Visit: Admitting: Nurse Practitioner

## 2024-05-03 ENCOUNTER — Telehealth: Payer: Self-pay

## 2024-05-03 NOTE — Telephone Encounter (Signed)
 Her hemoglobin is normal and she does not need IV iron 

## 2024-05-03 NOTE — Telephone Encounter (Signed)
 She called and left a message. She went to the urgent care yesterday due to not feeling well. She is requesting that Dr. Lonn look at her lab results and order IV iron  as soon as possible.

## 2024-05-03 NOTE — Telephone Encounter (Signed)
 Called and given below message. She verbalized understanding and will call PCP for continued symptoms.

## 2024-05-07 ENCOUNTER — Emergency Department (HOSPITAL_BASED_OUTPATIENT_CLINIC_OR_DEPARTMENT_OTHER)
Admission: EM | Admit: 2024-05-07 | Discharge: 2024-05-08 | Disposition: A | Discharge status: Home / Self Care | Attending: Emergency Medicine | Admitting: Emergency Medicine

## 2024-05-07 ENCOUNTER — Encounter (HOSPITAL_BASED_OUTPATIENT_CLINIC_OR_DEPARTMENT_OTHER): Payer: Self-pay

## 2024-05-07 ENCOUNTER — Emergency Department (HOSPITAL_BASED_OUTPATIENT_CLINIC_OR_DEPARTMENT_OTHER)

## 2024-05-07 ENCOUNTER — Other Ambulatory Visit: Payer: Self-pay

## 2024-05-07 DIAGNOSIS — R112 Nausea with vomiting, unspecified: Secondary | ICD-10-CM | POA: Insufficient documentation

## 2024-05-07 DIAGNOSIS — I10 Essential (primary) hypertension: Secondary | ICD-10-CM | POA: Diagnosis not present

## 2024-05-07 DIAGNOSIS — R110 Nausea: Secondary | ICD-10-CM

## 2024-05-07 DIAGNOSIS — R42 Dizziness and giddiness: Secondary | ICD-10-CM | POA: Insufficient documentation

## 2024-05-07 DIAGNOSIS — R197 Diarrhea, unspecified: Secondary | ICD-10-CM | POA: Insufficient documentation

## 2024-05-07 DIAGNOSIS — R519 Headache, unspecified: Secondary | ICD-10-CM | POA: Insufficient documentation

## 2024-05-07 LAB — CBC WITH DIFFERENTIAL/PLATELET
Abs Immature Granulocytes: 0.01 K/uL (ref 0.00–0.07)
Basophils Absolute: 0 K/uL (ref 0.0–0.1)
Basophils Relative: 1 %
Eosinophils Absolute: 0.1 K/uL (ref 0.0–0.5)
Eosinophils Relative: 2 %
HCT: 38.3 % (ref 36.0–46.0)
Hemoglobin: 12.8 g/dL (ref 12.0–15.0)
Immature Granulocytes: 0 %
Lymphocytes Relative: 34 %
Lymphs Abs: 2.3 K/uL (ref 0.7–4.0)
MCH: 28.8 pg (ref 26.0–34.0)
MCHC: 33.4 g/dL (ref 30.0–36.0)
MCV: 86.1 fL (ref 80.0–100.0)
Monocytes Absolute: 0.5 K/uL (ref 0.1–1.0)
Monocytes Relative: 7 %
Neutro Abs: 3.8 K/uL (ref 1.7–7.7)
Neutrophils Relative %: 56 %
Platelets: 324 K/uL (ref 150–400)
RBC: 4.45 MIL/uL (ref 3.87–5.11)
RDW: 12.4 % (ref 11.5–15.5)
WBC: 6.8 K/uL (ref 4.0–10.5)
nRBC: 0 % (ref 0.0–0.2)

## 2024-05-07 LAB — COMPREHENSIVE METABOLIC PANEL WITH GFR
ALT: 12 U/L (ref 0–44)
AST: 22 U/L (ref 15–41)
Albumin: 4.6 g/dL (ref 3.5–5.0)
Alkaline Phosphatase: 66 U/L (ref 38–126)
Anion gap: 12 (ref 5–15)
BUN: 7 mg/dL (ref 6–20)
CO2: 25 mmol/L (ref 22–32)
Calcium: 9.5 mg/dL (ref 8.9–10.3)
Chloride: 102 mmol/L (ref 98–111)
Creatinine, Ser: 0.6 mg/dL (ref 0.44–1.00)
GFR, Estimated: >60 mL/min (ref >60)
Glucose, Bld: 131 mg/dL — ABNORMAL HIGH (ref 70–99)
Potassium: 3.4 mmol/L — ABNORMAL LOW (ref 3.5–5.1)
Sodium: 140 mmol/L (ref 135–145)
Total Bilirubin: 0.4 mg/dL (ref 0.0–1.2)
Total Protein: 7.7 g/dL (ref 6.5–8.1)

## 2024-05-07 LAB — LIPASE, BLOOD: Lipase: 49 U/L (ref 11–51)

## 2024-05-07 LAB — MAGNESIUM: Magnesium: 2 mg/dL (ref 1.7–2.4)

## 2024-05-07 LAB — HCG, SERUM, QUALITATIVE: Preg, Serum: NEGATIVE

## 2024-05-07 MED ORDER — KETOROLAC TROMETHAMINE 15 MG/ML IJ SOLN
15.0000 mg | Freq: Once | INTRAMUSCULAR | Status: DC
  Filled 2024-05-07: qty 1

## 2024-05-07 MED ORDER — DIPHENHYDRAMINE HCL 50 MG/ML IJ SOLN
12.5000 mg | Freq: Once | INTRAMUSCULAR | Status: AC
  Administered 2024-05-07: 12.5 mg via INTRAVENOUS
  Filled 2024-05-07: qty 1

## 2024-05-07 MED ORDER — MORPHINE SULFATE (PF) 4 MG/ML IV SOLN
4.0000 mg | Freq: Once | INTRAVENOUS | Status: DC
  Filled 2024-05-07: qty 1

## 2024-05-07 MED ORDER — DEXAMETHASONE SOD PHOSPHATE PF 10 MG/ML IJ SOLN
10.0000 mg | Freq: Once | INTRAMUSCULAR | Status: AC
  Administered 2024-05-07: 10 mg via INTRAVENOUS

## 2024-05-07 MED ORDER — PROMETHAZINE HCL 12.5 MG PO TABS: 12.5000 mg | ORAL_TABLET | Freq: Four times a day (QID) | ORAL | 0 refills | Status: DC | PRN

## 2024-05-07 MED ORDER — IOHEXOL 350 MG/ML SOLN
75.0000 mL | Freq: Once | INTRAVENOUS | Status: AC | PRN
  Administered 2024-05-07: 75 mL via INTRAVENOUS

## 2024-05-07 MED ORDER — SODIUM CHLORIDE 0.9 % IV BOLUS
1000.0000 mL | Freq: Once | INTRAVENOUS | Status: AC
  Administered 2024-05-07: 1000 mL via INTRAVENOUS

## 2024-05-07 MED ORDER — PROCHLORPERAZINE EDISYLATE 10 MG/2ML IJ SOLN
10.0000 mg | Freq: Once | INTRAMUSCULAR | Status: AC
  Administered 2024-05-07: 10 mg via INTRAVENOUS
  Filled 2024-05-07: qty 2

## 2024-05-07 NOTE — ED Triage Notes (Signed)
 Headache, nausea and dizziness for 1 week.   Ambulatory, A&Ox4

## 2024-05-07 NOTE — ED Notes (Signed)
 Pt ambulatory to bathroom, shuffling gait, standby assistance.

## 2024-05-07 NOTE — Discharge Instructions (Signed)
 It was a pleasure taking care of you here today  Your workup today was reassuring  Make sure to follow-up outpatient, return for any worsening symptoms

## 2024-05-07 NOTE — ED Provider Notes (Signed)
 [facility] Provider Note   CSN: [id number] Arrival date & time: 05/07/24  1806     Patient presents with: Headache   Lindsey Carlson is a 43 y.o. female here for evaluation of feeling unwell.  Patient states about a week ago he had some friends over had a meal, drink some alcohol.  Woke up the next day with nausea vomiting and headache.  Stated everybody else improved however she continued to have persistent nausea.  Associated fatigue, NBNB emesis, diarrhea and myalgias.  No sudden onset thunderclap headache.  She denies any trauma or injury to her head.  No neck stiffness or neck rigidity.  No chest pain, shortness breath, cough.  No pain or swelling to legs.  No history of PE or DVT.  She was seen by her PCP on 11/12 who got some labs who thought likely related to anemia however heme-onc did not feel her levels were low enough for iron  transfusion.  She denies any fever, congestion or rhinorrhea.  Patient states that she just feels very poorly.  Taking OTC meds without relief.  No diplopia.  She describes some lightheadedness and dizziness however has difficult time describing the lightheadedness or dizziness.  No unilateral numbness, weakness.  No slurred speech.  History of RA, well-controlled.  No history of MS, thrombosis.   HPI     Prior to Admission medications   Medication Sig Start Date End Date Taking? Authorizing Provider  promethazine  (PHENERGAN ) 12.5 MG tablet Take 1 tablet (12.5 mg total) by mouth every 6 (six) hours as needed for up to 5 days for nausea or vomiting. 05/07/24 05/12/24 Yes Doylene Splinter A, PA-C  amLODipine  (NORVASC ) 10 MG tablet Take 1 tablet (10 mg total) by mouth daily. 08/26/23   [name], Roselie Rockford, NP  celecoxib  (CELEBREX ) 100 MG capsule Take 1 capsule (100 mg total) by mouth 2 (two) times daily as needed. Patient not taking: Reported on 05/02/2024 01/25/24   Jeannetta Lonni ORN, MD  ondansetron  (ZOFRAN ) 4 MG  tablet Take 1 tablet (4 mg total) by mouth every 8 (eight) hours as needed for nausea or vomiting. 05/02/24   [name], Roselie Rockford, NP    Allergies: Iron  sucrose    Review of Systems  Constitutional:  Positive for fatigue.  HENT: Negative.    Respiratory: Negative.    Cardiovascular: Negative.   Gastrointestinal:  Positive for nausea and vomiting. Negative for abdominal distention, abdominal pain, anal bleeding, blood in stool, constipation, diarrhea and rectal pain.  Genitourinary: Negative.   Musculoskeletal:  Positive for myalgias. Negative for back pain, neck pain and neck stiffness.  Skin: Negative.   Neurological:  Positive for dizziness, weakness (generalized), light-headedness and headaches. Negative for tremors, seizures, syncope, facial asymmetry, speech difficulty and numbness.  All other systems reviewed and are negative.   Updated Vital Signs BP 118/71   Pulse 91   Temp (!) 97.5 F (36.4 C) (Oral)   Resp 17   LMP 04/27/2024   SpO2 100%   Physical Exam Physical Exam  Constitutional: Pt is oriented to person, place, and time. Pt appears well-developed and well-nourished. No distress.  HENT:  Head: Normocephalic and atraumatic.  Mouth/Throat: Oropharynx is clear and moist.  Eyes: Conjunctivae and EOM are normal. Pupils are equal, round, and reactive to light. No scleral icterus.  No horizontal, vertical or rotational nystagmus  Neck: Normal range of motion. Neck supple.  Full active and passive ROM without pain No midline or paraspinal tenderness  No nuchal rigidity or meningeal signs  Cardiovascular: Normal rate, regular rhythm and intact distal pulses.   Pulmonary/Chest: Effort normal and breath sounds normal. No respiratory distress. Pt has no wheezes. No rales.  Abdominal: Soft. Bowel sounds are normal. There is no tenderness. There is no rebound and no guarding.  Musculoskeletal: Normal range of motion.  Lymphadenopathy:    No cervical adenopathy.   Neurological: Pt. is alert and oriented to person, place, and time. He has normal reflexes. No cranial nerve deficit.  Exhibits normal muscle tone. Coordination normal.  Mental Status:  Alert, oriented, thought content appropriate. Speech fluent without evidence of aphasia. Able to follow 2 step commands without difficulty.  Cranial Nerves:  2-12 grossly intact Motor:  5/5 in upper and lower extremities bilaterally including strong and equal grip strength and dorsiflexion/plantar flexion Sensory:Intact sensation Deep Tendon Reflexes: 2/symmetric  Cerebellar: normal F2N Gait: normal gait and balance CV: distal pulses palpable throughout   Skin: Skin is warm and dry. No rash noted. Pt is not diaphoretic.  Psychiatric: Pt has a normal mood and affect. Behavior is normal. Judgment and thought content normal.  Nursing note and vitals reviewed.  (all labs ordered are listed, but only abnormal results are displayed) Labs Reviewed  COMPREHENSIVE METABOLIC PANEL WITH GFR - Abnormal; Notable for the following components:      Result Value   Potassium 3.4 (*)    Glucose, Bld 131 (*)    All other components within normal limits  CBC WITH DIFFERENTIAL/PLATELET  HCG, SERUM, QUALITATIVE  MAGNESIUM  LIPASE, BLOOD  URINALYSIS, W/ REFLEX TO CULTURE (INFECTION SUSPECTED)    EKG: EKG Interpretation Date/Time:  Monday May 07 2024 18:34:05 EST Ventricular Rate:  75 PR Interval:  160 QRS Duration:  89 QT Interval:  387 QTC Calculation: 433 R Axis:   9  Text Interpretation: Sinus rhythm Atrial premature complex Low voltage, precordial leads Borderline T abnormalities, anterior leads Confirmed by Jerrol Agent ([id number]) on 05/07/2024 7:17:28 PM  Radiology: CT ANGIO HEAD NECK W WO CM Result Date: 05/07/2024 EXAM: CTA Head and Neck with Intravenous Contrast. CT Head without Contrast. CLINICAL HISTORY: Syncope/presyncope, cerebrovascular cause suspected; dizziness, HA TECHNIQUE: Axial CTA images  of the head and neck performed with intravenous contrast. MIP reconstructed images were created and reviewed. Axial computed tomography images of the head/brain performed without intravenous contrast. Note: Per PQRS, the description of internal carotid artery percent stenosis, including 0 percent or normal exam, is based on [employer] American Symptomatic Carotid Endarterectomy Trial (NASCET) criteria. Dose reduction technique was used including one or more of the following: automated exposure control, adjustment of mA and kV according to patient size, and/or iterative reconstruction. CONTRAST: With; COMPARISON: None provided. FINDINGS: Limited study due to beam hardening. Within this limitation: BRAIN: No acute intraparenchymal hemorrhage. No mass lesion. No CT evidence for acute territorial infarct. No midline shift or extra-axial collection. VENTRICLES: No hydrocephalus. ORBITS: The orbits are unremarkable. SINUSES AND MASTOIDS: The paranasal sinuses and mastoid air cells are clear. COMMON CAROTID ARTERIES: No significant stenosis. No dissection or occlusion. INTERNAL CAROTID ARTERIES: No stenosis by NASCET criteria. No dissection or occlusion. VERTEBRAL ARTERIES: No significant stenosis. No dissection or occlusion. ANTERIOR CEREBRAL ARTERIES: No significant stenosis. No occlusion. No aneurysm. MIDDLE CEREBRAL ARTERIES: No significant stenosis. No occlusion. No aneurysm. POSTERIOR CEREBRAL ARTERIES: No significant stenosis. No occlusion. No aneurysm. BASILAR ARTERY: No significant stenosis. No occlusion. No aneurysm. Basilar artery fenestration, anatomic variant. SOFT TISSUES: No acute finding. No masses or lymphadenopathy. BONES:  No acute osseous abnormality. IMPRESSION: 1. No large vessel occlusion or singificant stenosis. 2. No evidence of acute intracranial abnormality on limited CT head. Electronically signed by: Gilmore Molt MD 05/07/2024 11:05 PM EST RP Workstation: [id number]     Procedures    Medications Ordered in the ED  ketorolac (TORADOL) 15 MG/ML injection 15 mg (has no administration in time range)  dexamethasone  (DECADRON ) injection 10 mg (has no administration in time range)  prochlorperazine (COMPAZINE) injection 10 mg (10 mg Intravenous Given 05/07/24 2007)  diphenhydrAMINE (BENADRYL) injection 12.5 mg (12.5 mg Intravenous Given 05/07/24 2003)  sodium chloride  0.9 % bolus 1,000 mL (1,000 mLs Intravenous New Bag/Given 05/07/24 2001)  iohexol  (OMNIPAQUE ) 350 MG/ML injection 75 mL (75 mLs Intravenous Contrast Given 05/07/24 2156)   Here for evaluation of feeling unwell and headache and nausea over the last week.  Was initially seen by PCP 5 days ago had labs drawn which did not show significant findings.  She states she still has a posterior headache.  Here she has a nonfocal neuroexam.  She has no neck stiffness or neck rigidity.  No diplopia, numbness, weakness, vision changes.  Associated nausea.  Will plan on labs, imaging, reassess  Labs and imaging personally viewed and interpreted:  CBC wo leukocytosis CMP potassium 3.4 Mag 2.0 Lipase 49 Preg UA CTA head/neck neg EKG wo ischemic changes  Patient reassessed.  Headache resolved.  Tolerating p.o. intake.  Question migraine.  Will give Decadron  here and help prevent rebound headache.  At this time I have low suspicion for acute CVA, dissection, IIH, SAH, meningitis, demyelinating process, dural thrombosis, ischemia, pres, encephalitis.  The patient has been appropriately medically screened and/or stabilized in the ED. I have low suspicion for any other emergent medical condition which would require further screening, evaluation or treatment in the ED or require inpatient management.  Patient is hemodynamically stable and in no acute distress.  Patient able to ambulate in department prior to ED.  Evaluation does not show acute pathology that would require ongoing or additional emergent interventions while in the  emergency department or further inpatient treatment.  I have discussed the diagnosis with the patient and answered all questions.  Pain is been managed while in the emergency department and patient has no further complaints prior to discharge.  Patient is comfortable with plan discussed in room and is stable for discharge at this time.  I have discussed strict return precautions for returning to the emergency department.  Patient was encouraged to follow-up with PCP/specialist refer to at discharge.                                   Medical Decision Making Amount and/or Complexity of Data Reviewed Independent Historian: friend External Data Reviewed: labs, radiology and notes. Labs: ordered. Decision-making details documented in ED Course. Radiology: ordered and independent interpretation performed. Decision-making details documented in ED Course.  Risk OTC drugs. Prescription drug management. Parenteral controlled substances. Decision regarding hospitalization. Diagnosis or treatment significantly limited by social determinants of health.      Final diagnoses:  Acute nonintractable headache, unspecified headache type  Nausea    ED Discharge Orders          Ordered    promethazine  (PHENERGAN ) 12.5 MG tablet  Every 6 hours PRN        05/07/24 2312  Zhania Shaheen A, PA-C 05/07/24 2314    Jerrol Agent, MD 05/08/24 1146

## 2024-05-08 NOTE — Telephone Encounter (Signed)
 Patient did go to the ER yesterday and is feeling much better. Dm/cma

## 2024-05-09 ENCOUNTER — Telehealth: Payer: Self-pay

## 2024-05-09 NOTE — Telephone Encounter (Unsigned)
 Copied from CRM [id number]. Topic: Clinical - Red Word Triage >> May 09, 2024  3:19 PM Mesmerise C wrote: [facility] that prompted transfer to Nurse Triage: Patient was in the ED on 11/17 for dizziness and headaches got test results back and sugar levels are 131 states no symptoms other than issues with vision but been ongoing for awhile but concerned if sugar is an issue, while on hold patient disconnected attempted to call back no response

## 2024-05-10 ENCOUNTER — Ambulatory Visit: Admitting: Nurse Practitioner

## 2024-05-10 ENCOUNTER — Encounter: Payer: Self-pay | Admitting: Nurse Practitioner

## 2024-05-10 VITALS — BP 92/70 | HR 72 | Temp 97.0°F | Ht 61.0 in | Wt 135.6 lb

## 2024-05-10 DIAGNOSIS — R7309 Other abnormal glucose: Secondary | ICD-10-CM

## 2024-05-10 DIAGNOSIS — I10 Essential (primary) hypertension: Secondary | ICD-10-CM

## 2024-05-10 LAB — POCT GLUCOSE (DEVICE FOR HOME USE)
Glucose Fasting, POC: 85 mg/dL (ref 70–99)
POC Glucose: 85 mg/dL (ref 70–99)

## 2024-05-10 LAB — POCT GLYCOSYLATED HEMOGLOBIN (HGB A1C)
HbA1c POC (<> result, manual entry): 5.1 % (ref 4.0–5.6)
HbA1c, POC (controlled diabetic range): 5.1 % (ref 0.0–7.0)
HbA1c, POC (prediabetic range): 5.1 % — AB (ref 5.7–6.4)
Hemoglobin A1C: 5.1 % (ref 4.0–5.6)

## 2024-05-10 MED ORDER — AMLODIPINE BESYLATE 5 MG PO TABS: 5.0000 mg | ORAL_TABLET | Freq: Every day | ORAL | 0 refills | Status: AC

## 2024-05-10 NOTE — Telephone Encounter (Signed)
 Noted. Patient seen 05/10/24 by Tinnie Seidel, NP

## 2024-05-10 NOTE — Progress Notes (Signed)
 Acute Office Visit  Subjective:     Patient ID: Lindsey Carlson, female    DOB: [birth year], 43 y.o.   MRN: [id number]  Chief Complaint  Patient presents with   Headache    Santina to ED on Monday, concerns with Family Hx of Diabetes, appetite loss    HPI Discussed the use of AI scribe software for clinical note transcription with the patient, who gave verbal consent to proceed.  History of Present Illness   Lindsey Carlson is a 43 year old female with hypertension who presents with dizziness and nausea following an emergency room visit.  She experienced severe dizziness and nausea after consuming steak and wine at home, accompanied by vomiting and diarrhea, which prevented her from going to work. The dizziness is described as a 'woozy, unstable' feeling similar to a hangover. At the ER, she received IV fluids and medication, with high blood sugar noted.  She has hypertension and is on blood pressure medication. Recently, she has been losing weight and eating less. She takes amlodipine  10mg  daily.  She has not vomited recently but feels an uncomfortable sensation that is not dizziness. She experiences frequent urination without burning and has had some blurry vision. No current diarrhea or neck pain.  Her family history includes diabetes, she is concerned with elevated blood sugar in ER. She has a history of low iron  and receives infusions, but her hemoglobin levels are currently adequate.     ROS See pertinent positives and negatives per HPI.     Objective:    BP 92/70 (BP Location: Left Arm, Cuff Size: Normal)   Pulse 72   Temp (!) 97 F (36.1 C)   Ht 5' 1 (1.549 m)   Wt 135 lb 9.6 oz (61.5 kg)   LMP 04/27/2024 (Exact Date)   SpO2 98%   BMI 25.62 kg/m  BP Readings from Last 3 Encounters:  05/10/24 92/70  05/07/24 118/71  05/02/24 116/74   Wt Readings from Last 3 Encounters:  05/10/24 135 lb 9.6 oz (61.5 kg)  05/02/24 137 lb 9.6 oz (62.4 kg)  01/25/24 138 lb 3.2 oz  (62.7 kg)      Physical Exam Vitals and nursing note reviewed.  Constitutional:      General: She is not in acute distress.    Appearance: Normal appearance.  HENT:     Head: Normocephalic.  Eyes:     Conjunctiva/sclera: Conjunctivae normal.  Cardiovascular:     Rate and Rhythm: Normal rate and regular rhythm.     Pulses: Normal pulses.     Heart sounds: Normal heart sounds.  Pulmonary:     Effort: Pulmonary effort is normal.     Breath sounds: Normal breath sounds.  Abdominal:     Palpations: Abdomen is soft.     Tenderness: There is no abdominal tenderness.  Musculoskeletal:     Cervical back: Normal range of motion.  Skin:    General: Skin is warm.  Neurological:     General: No focal deficit present.     Mental Status: She is alert and oriented to person, place, and time.     Comments: Dix hallpike maneuver negative bilaterally   Psychiatric:        Mood and Affect: Mood normal.        Behavior: Behavior normal.        Thought Content: Thought content normal.        Judgment: Judgment normal.     Results for orders placed or  performed in visit on 05/10/24  POCT Glucose (Device for Home Use)  Result Value Ref Range   Glucose Fasting, POC 85 70 - 99 mg/dL   POC Glucose 85 70 - 99 mg/dl  POCT glycosylated hemoglobin (Hb A1C)  Result Value Ref Range   Hemoglobin A1C 5.1 4.0 - 5.6 %   HbA1c POC (<> result, manual entry) 5.1 4.0 - 5.6 %   HbA1c, POC (prediabetic range) 5.1 (A) 5.7 - 6.4 %   HbA1c, POC (controlled diabetic range) 5.1 0.0 - 7.0 %        Assessment & Plan:   Problem List Items Addressed This Visit       Cardiovascular and Mediastinum   Primary hypertension   Blood pressure is 92/70 mmHg, possibly affected by recent illness, weight loss, and dietary changes. Decrease amlodipine  to 5 mg daily. Instruct her to monitor blood pressure at home. Follow-up in four weeks to reassess blood pressure and medication.       Relevant Medications    amLODipine  (NORVASC ) 5 MG tablet     Other   Elevated glucose - Primary   Fasting glucose elevated in ER at 131. Today glucose 85 and A1c 5.1%. Discussed elevation in ER most likely related to stress of recent illness. Sugars are normal and she does not have diabetes.       Relevant Orders   POCT Glucose (Device for Home Use) (Completed)   POCT glycosylated hemoglobin (Hb A1C) (Completed)    Meds ordered this encounter  Medications   amLODipine  (NORVASC ) 5 MG tablet    Sig: Take 1 tablet (5 mg total) by mouth daily.    Dispense:  90 tablet    Refill:  0    Return in about 4 weeks (around 06/07/2024) for HTN with PCP.  Tinnie DELENA Harada, NP

## 2024-05-10 NOTE — Patient Instructions (Signed)
 It was great to see you!  Decrease your amlodipine  to 5 mg daily - check your blood pressure at home   Your sugars are normal   Let's follow-up in 4 weeks, sooner if you have concerns.  If a referral was placed today, you will be contacted for an appointment. Please note that routine referrals can sometimes take up to 3-4 weeks to process. Please call our office if you haven't heard anything after this time frame.  Take care,  Tinnie Harada, NP

## 2024-05-11 ENCOUNTER — Encounter: Payer: Self-pay | Admitting: Nurse Practitioner

## 2024-05-11 DIAGNOSIS — R7309 Other abnormal glucose: Secondary | ICD-10-CM | POA: Insufficient documentation

## 2024-05-11 NOTE — Assessment & Plan Note (Signed)
 Fasting glucose elevated in ER at 131. Today glucose 85 and A1c 5.1%. Discussed elevation in ER most likely related to stress of recent illness. Sugars are normal and she does not have diabetes.

## 2024-05-11 NOTE — Assessment & Plan Note (Signed)
 Blood pressure is 92/70 mmHg, possibly affected by recent illness, weight loss, and dietary changes. Decrease amlodipine  to 5 mg daily. Instruct her to monitor blood pressure at home. Follow-up in four weeks to reassess blood pressure and medication.

## 2024-05-14 ENCOUNTER — Ambulatory Visit
Admission: RE | Admit: 2024-05-14 | Discharge: 2024-05-14 | Disposition: A | Discharge status: Home / Self Care | Source: Ambulatory Visit | Attending: Nurse Practitioner | Admitting: Nurse Practitioner

## 2024-05-14 VITALS — BP 121/82 | HR 77 | Temp 98.2°F | Resp 16

## 2024-05-14 DIAGNOSIS — N898 Other specified noninflammatory disorders of vagina: Secondary | ICD-10-CM | POA: Insufficient documentation

## 2024-05-14 DIAGNOSIS — R21 Rash and other nonspecific skin eruption: Secondary | ICD-10-CM | POA: Insufficient documentation

## 2024-05-14 MED ORDER — NYSTATIN 100000 UNIT/GM EX CREA: TOPICAL_CREAM | CUTANEOUS | 0 refills | Status: DC

## 2024-05-14 MED ORDER — FLUCONAZOLE 150 MG PO TABS: 150.0000 mg | ORAL_TABLET | Freq: Once | ORAL | 0 refills | Status: AC
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
## 2024-05-14 NOTE — ED Triage Notes (Signed)
 Pt c/o white vaginal discharge since Friday. States has a tender bump on vagina since Friday.

## 2024-05-14 NOTE — Discharge Instructions (Addendum)
 Pending vaginal swab for yeast, bacterial vaginitis, gonorrhea, chlamydia, and trichomonas. Will go ahead and preemptively treat for yeast vaginitis with Diflucan  and topical Nystatin  cream. Keep your vaginal area clean and dry, avoid sexual activity until treatment is completed and symptoms have cleared.

## 2024-05-15 ENCOUNTER — Ambulatory Visit (HOSPITAL_COMMUNITY): Payer: Self-pay

## 2024-05-15 ENCOUNTER — Ambulatory Visit: Admitting: Nurse Practitioner

## 2024-05-15 MED ORDER — METRONIDAZOLE 500 MG PO TABS: 500.0000 mg | ORAL_TABLET | Freq: Two times a day (BID) | ORAL | 0 refills | Status: AC

## 2024-05-16 LAB — CERVICOVAGINAL ANCILLARY ONLY
Bacterial Vaginitis (gardnerella): POSITIVE — AB
Candida Glabrata: NEGATIVE
Candida Vaginitis: NEGATIVE
Chlamydia: NEGATIVE
Comment: NEGATIVE
Comment: NEGATIVE
Comment: NEGATIVE
Comment: NEGATIVE
Comment: NEGATIVE
Comment: NORMAL
Neisseria Gonorrhea: NEGATIVE
Trichomonas: NEGATIVE

## 2024-05-29 ENCOUNTER — Ambulatory Visit: Admitting: Nurse Practitioner

## 2024-06-11 ENCOUNTER — Ambulatory Visit
Admission: RE | Admit: 2024-06-11 | Discharge: 2024-06-11 | Disposition: A | Discharge status: Home / Self Care | Attending: Emergency Medicine | Admitting: Emergency Medicine

## 2024-06-11 ENCOUNTER — Other Ambulatory Visit: Payer: Self-pay

## 2024-06-11 VITALS — BP 121/86 | HR 63 | Temp 98.0°F | Resp 17 | Ht 61.0 in | Wt 132.0 lb

## 2024-06-11 DIAGNOSIS — R1013 Epigastric pain: Secondary | ICD-10-CM

## 2024-06-11 DIAGNOSIS — K219 Gastro-esophageal reflux disease without esophagitis: Secondary | ICD-10-CM | POA: Diagnosis not present

## 2024-06-11 MED ORDER — OMEPRAZOLE 20 MG PO CPDR: 20.0000 mg | DELAYED_RELEASE_CAPSULE | Freq: Two times a day (BID) | ORAL | 0 refills | Status: DC

## 2024-06-11 MED ORDER — ALUM & MAG HYDROXIDE-SIMETH 200-200-20 MG/5ML PO SUSP
30.0000 mL | Freq: Once | ORAL | Status: AC
  Administered 2024-06-11: 30 mL via ORAL

## 2024-06-11 MED ORDER — LIDOCAINE VISCOUS HCL 2 % MT SOLN
15.0000 mL | Freq: Once | OROMUCOSAL | Status: AC
  Administered 2024-06-11: 15 mL via OROMUCOSAL
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
## 2024-06-11 NOTE — Discharge Instructions (Addendum)
 Your symptoms appear consistent with acid reflux.  Take the omeprazole  twice daily before meals, at least 30 minutes.  Eat smaller more frequent meals and stay upright afterwards to see if this helps with your symptoms.  You can take Tums as needed.  Follow-up with your primary care provider in the next month to assess treatment response.  You are at higher risk for acid reflux due to your history of RA.  I suggest avoiding NSAIDs such as Aleve , naproxen  or ibuprofen  and use Tylenol  as needed for pain.  Seek immediate care for new concerning symptoms

## 2024-06-11 NOTE — ED Triage Notes (Signed)
 Pt presents with a chief complaint of heartburn x 2 weeks. States she has not ate any foods today due to the fear of pain/burning in chest. Pain occurs after every time she eats. Feels relief with burping. Has been taking OTC Pepcid + TUMS for symptoms with minimal improvement/relief. Currently rates overall pain a 5/10.

## 2024-06-15 ENCOUNTER — Ambulatory Visit: Payer: Self-pay

## 2024-06-15 ENCOUNTER — Ambulatory Visit: Admitting: Family Medicine

## 2024-06-15 ENCOUNTER — Encounter: Payer: Self-pay | Admitting: Family Medicine

## 2024-06-15 VITALS — BP 123/80 | HR 77 | Temp 98.0°F | Ht 61.0 in | Wt 135.6 lb

## 2024-06-15 DIAGNOSIS — K2900 Acute gastritis without bleeding: Secondary | ICD-10-CM | POA: Diagnosis not present

## 2024-06-15 DIAGNOSIS — R1013 Epigastric pain: Secondary | ICD-10-CM

## 2024-06-15 DIAGNOSIS — R112 Nausea with vomiting, unspecified: Secondary | ICD-10-CM | POA: Diagnosis not present

## 2024-06-15 MED ORDER — SUCRALFATE 1 G PO TABS: 1.0000 g | ORAL_TABLET | Freq: Four times a day (QID) | ORAL | 0 refills | Status: AC

## 2024-06-15 MED ORDER — PANTOPRAZOLE SODIUM 40 MG PO TBEC: 40.0000 mg | DELAYED_RELEASE_TABLET | Freq: Two times a day (BID) | ORAL | 0 refills | Status: AC
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
## 2024-06-15 NOTE — Telephone Encounter (Signed)
 FYI Only or Action Required?: FYI only for provider: ED advised.  Patient was last seen in primary care on 05/10/2024 by Nedra Tinnie LABOR, NP.  Called Nurse Triage reporting Abdominal Pain.  Symptoms began 1.5 months ago.  Interventions attempted: Prescription medications: omeprazole .  Symptoms are: gradually worsening.  Triage Disposition: Go to ED Now (Notify PCP)  Patient/caregiver understands and will follow disposition?: Yes              Copied from CRM [id number]. Topic: Clinical - Red Word Triage >> Jun 15, 2024 10:09 AM Shereese L wrote: [facility] that prompted transfer to Nurse Triage: Pain in stomach upper abdominal area and lower back Reason for Disposition  [1] Pain lasts > 10 minutes AND [2] age > 29 AND [3] at least one cardiac risk factor (e.g., diabetes, high cholesterol, hypertension, obesity, smoker or strong family history of heart disease)  Answer Assessment - Initial Assessment Questions 1. LOCATION: Where does it hurt?      Upper epigastric.  2. RADIATION: Does the pain shoot anywhere else? (e.g., chest, back)     Mid back.  3. ONSET: When did the pain begin? (e.g., minutes, hours or days ago)      1.5 months ago, worsening for the past 2 weeks.  4. SUDDEN: Gradual or sudden onset?     N/A.  5. PATTERN Does the pain come and go, or is it constant?     On and off but states the past has been present since yesterday.  6. SEVERITY: How bad is the pain?  (e.g., Scale 1-10; mild, moderate, or severe)     6/10.  7. RECURRENT SYMPTOM: Have you ever had this type of stomach pain before? If Yes, ask: When was the last time? and What happened that time?      She was seen at urgent care on 12/22 for this pain and again in the ED on 11/17.  8. AGGRAVATING FACTORS: Does anything seem to cause this pain? (e.g., foods, stress, alcohol)     She states she avoids greasy, fatty/fried, citrus foods.  9. CARDIAC SYMPTOMS: Do you have  any of the following symptoms: chest pain, difficulty breathing, sweating, nausea?     No chest pain. She states nausea and vomiting every couple of days. No vomiting for the past 24 hours.  10. OTHER SYMPTOMS: Do you have any other symptoms? (e.g., back pain, diarrhea, fever, urination pain, vomiting)       Increased gas.  11. PREGNANCY: Is there any chance you are pregnant? When was your last menstrual period?       LMP: 05/25/24.  Protocols used: Abdominal Pain - Upper-A-AH

## 2024-06-15 NOTE — Telephone Encounter (Signed)
 Patient scheduled to go to [facility] office for a acute visit

## 2024-06-16 LAB — COMPREHENSIVE METABOLIC PANEL WITH GFR
AG Ratio: 1.3 (calc) (ref 1.0–2.5)
ALT: 9 U/L (ref 6–29)
AST: 14 U/L (ref 10–35)
Albumin: 4.4 g/dL (ref 3.6–5.1)
Alkaline phosphatase (APISO): 64 U/L (ref 31–125)
BUN: 8 mg/dL (ref 7–25)
CO2: 24 mmol/L (ref 20–32)
Calcium: 9.2 mg/dL (ref 8.6–10.2)
Chloride: 105 mmol/L (ref 98–110)
Creat: 0.53 mg/dL (ref 0.50–0.99)
Globulin: 3.3 g/dL (ref 1.9–3.7)
Glucose, Bld: 88 mg/dL (ref 65–99)
Potassium: 4 mmol/L (ref 3.5–5.3)
Sodium: 136 mmol/L (ref 135–146)
Total Bilirubin: 0.3 mg/dL (ref 0.2–1.2)
Total Protein: 7.7 g/dL (ref 6.1–8.1)
eGFR: 116 mL/min/1.73m2

## 2024-06-16 LAB — CBC WITH DIFFERENTIAL/PLATELET
Absolute Lymphocytes: 3027 {cells}/uL (ref 850–3900)
Absolute Monocytes: 562 {cells}/uL (ref 200–950)
Basophils Absolute: 59 {cells}/uL (ref 0–200)
Basophils Relative: 0.8 %
Eosinophils Absolute: 111 {cells}/uL (ref 15–500)
Eosinophils Relative: 1.5 %
HCT: 40 % (ref 35.9–46.0)
Hemoglobin: 13 g/dL (ref 11.7–15.5)
MCH: 27.9 pg (ref 27.0–33.0)
MCHC: 32.5 g/dL (ref 31.6–35.4)
MCV: 85.8 fL (ref 81.4–101.7)
MPV: 11 fL (ref 7.5–12.5)
Monocytes Relative: 7.6 %
Neutro Abs: 3641 {cells}/uL (ref 1500–7800)
Neutrophils Relative %: 49.2 %
Platelets: 293 Thousand/uL (ref 140–400)
RBC: 4.66 Million/uL (ref 3.80–5.10)
RDW: 12.5 % (ref 11.0–15.0)
Total Lymphocyte: 40.9 %
WBC: 7.4 Thousand/uL (ref 3.8–10.8)

## 2024-06-16 LAB — LIPASE: Lipase: 45 U/L (ref 7–60)

## 2024-06-17 ENCOUNTER — Ambulatory Visit: Payer: Self-pay | Admitting: Family Medicine

## 2024-06-19 ENCOUNTER — Ambulatory Visit (HOSPITAL_BASED_OUTPATIENT_CLINIC_OR_DEPARTMENT_OTHER)
Admission: RE | Admit: 2024-06-19 | Discharge: 2024-06-19 | Disposition: A | Discharge status: Home / Self Care | Source: Ambulatory Visit | Attending: Family Medicine | Admitting: Family Medicine

## 2024-06-19 DIAGNOSIS — R1013 Epigastric pain: Secondary | ICD-10-CM | POA: Diagnosis not present

## 2024-06-19 DIAGNOSIS — K824 Cholesterolosis of gallbladder: Secondary | ICD-10-CM | POA: Diagnosis not present

## 2024-06-25 NOTE — Telephone Encounter (Signed)
 The mass is a small polyp-->NOT cancer.

## 2024-06-26 NOTE — Telephone Encounter (Signed)
 Tried calling patient, unable to LVM

## 2024-07-08 ENCOUNTER — Other Ambulatory Visit: Payer: Self-pay | Admitting: Family Medicine

## 2024-07-18 NOTE — Assessment & Plan Note (Signed)
 SABRA

## 2024-07-18 NOTE — Assessment & Plan Note (Signed)
 Lindsey Carlson
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
## 2024-07-23 ENCOUNTER — Ambulatory Visit (HOSPITAL_COMMUNITY)

## 2024-07-24 ENCOUNTER — Ambulatory Visit

## 2024-07-24 ENCOUNTER — Other Ambulatory Visit: Payer: Self-pay

## 2024-07-24 ENCOUNTER — Ambulatory Visit
Admission: EM | Admit: 2024-07-24 | Discharge: 2024-07-24 | Disposition: A | Discharge status: Home / Self Care | Source: Home / Self Care

## 2024-07-24 ENCOUNTER — Encounter: Payer: Self-pay | Admitting: Emergency Medicine

## 2024-07-24 DIAGNOSIS — J101 Influenza due to other identified influenza virus with other respiratory manifestations: Secondary | ICD-10-CM

## 2024-07-24 LAB — POCT INFLUENZA A/B
Influenza A, POC: POSITIVE — AB
Influenza B, POC: NEGATIVE

## 2024-07-24 LAB — POC SOFIA SARS ANTIGEN FIA: SARS Coronavirus 2 Ag: NEGATIVE

## 2024-07-24 MED ORDER — OSELTAMIVIR PHOSPHATE 75 MG PO CAPS: 75.0000 mg | ORAL_CAPSULE | Freq: Two times a day (BID) | ORAL | 0 refills | Status: AC

## 2024-07-24 MED ORDER — ACETAMINOPHEN 325 MG PO TABS
650.0000 mg | ORAL_TABLET | Freq: Once | ORAL | Status: AC
  Administered 2024-07-24: 650 mg via ORAL

## 2024-07-30 ENCOUNTER — Ambulatory Visit: Admitting: Internal Medicine

## 2024-07-30 DIAGNOSIS — M069 Rheumatoid arthritis, unspecified: Secondary | ICD-10-CM

## 2024-07-30 DIAGNOSIS — M722 Plantar fascial fibromatosis: Secondary | ICD-10-CM

## 2024-07-30 DIAGNOSIS — M21611 Bunion of right foot: Secondary | ICD-10-CM

## 2024-07-30 DIAGNOSIS — L811 Chloasma: Secondary | ICD-10-CM

## 2024-08-31 ENCOUNTER — Inpatient Hospital Stay

## 2024-08-31 ENCOUNTER — Inpatient Hospital Stay: Admitting: Hematology and Oncology
